# Patient Record
Sex: Male | Born: 1970 | Hispanic: Yes | Marital: Married | Smoking: Never smoker
Health system: Southern US, Community
[De-identification: ages and names within clinical notes are randomized; demographics above are authoritative.]

## PROBLEM LIST (undated history)

## (undated) DIAGNOSIS — F1010 Alcohol abuse, uncomplicated: Secondary | ICD-10-CM

---

## 1998-04-11 ENCOUNTER — Emergency Department (HOSPITAL_COMMUNITY): Admission: EM | Admit: 1998-04-11 | Discharge: 1998-04-11 | Payer: Self-pay | Admitting: Emergency Medicine

## 2001-01-01 ENCOUNTER — Emergency Department (HOSPITAL_COMMUNITY): Admission: EM | Admit: 2001-01-01 | Discharge: 2001-01-01 | Payer: Self-pay | Admitting: Emergency Medicine

## 2001-01-01 ENCOUNTER — Encounter: Payer: Self-pay | Admitting: Emergency Medicine

## 2004-01-15 ENCOUNTER — Emergency Department (HOSPITAL_COMMUNITY): Admission: EM | Admit: 2004-01-15 | Discharge: 2004-01-15 | Payer: Self-pay | Admitting: Family Medicine

## 2004-08-19 ENCOUNTER — Emergency Department (HOSPITAL_COMMUNITY): Admission: EM | Admit: 2004-08-19 | Discharge: 2004-08-19 | Payer: Self-pay | Admitting: Emergency Medicine

## 2007-02-02 ENCOUNTER — Ambulatory Visit: Payer: Self-pay | Admitting: Internal Medicine

## 2007-03-11 ENCOUNTER — Ambulatory Visit: Payer: Self-pay | Admitting: Internal Medicine

## 2007-03-11 LAB — CONVERTED CEMR LAB
Cholesterol: 212 mg/dL — ABNORMAL HIGH (ref 0–200)
HDL: 40 mg/dL (ref >39)
LDL Cholesterol: 142 mg/dL — ABNORMAL HIGH (ref 0–99)
Total CHOL/HDL Ratio: 5.3
Triglycerides: 148 mg/dL (ref <150)
VLDL: 30 mg/dL (ref 0–40)

## 2007-03-18 ENCOUNTER — Ambulatory Visit: Payer: Self-pay | Admitting: *Deleted

## 2007-04-09 ENCOUNTER — Ambulatory Visit: Payer: Self-pay | Admitting: Internal Medicine

## 2007-04-09 LAB — CONVERTED CEMR LAB
ALT: 31 units/L (ref 0–53)
AST: 24 units/L (ref 0–37)
Albumin: 4.6 g/dL (ref 3.5–5.2)
Alkaline Phosphatase: 77 units/L (ref 39–117)
BUN: 15 mg/dL (ref 6–23)
CO2: 26 meq/L (ref 19–32)
Calcium: 9.4 mg/dL (ref 8.4–10.5)
Chloride: 101 meq/L (ref 96–112)
Cholesterol: 185 mg/dL (ref 0–200)
Creatinine, Ser: 1.1 mg/dL (ref 0.40–1.50)
Glucose, Bld: 114 mg/dL — ABNORMAL HIGH (ref 70–99)
HDL: 38 mg/dL — ABNORMAL LOW (ref >39)
LDL Cholesterol: 75 mg/dL (ref 0–99)
Potassium: 3.9 meq/L (ref 3.5–5.3)
Sodium: 138 meq/L (ref 135–145)
Total Bilirubin: 0.4 mg/dL (ref 0.3–1.2)
Total CHOL/HDL Ratio: 4.9
Total Protein: 7.6 g/dL (ref 6.0–8.3)
Triglycerides: 358 mg/dL — ABNORMAL HIGH (ref <150)
VLDL: 72 mg/dL — ABNORMAL HIGH (ref 0–40)

## 2007-06-10 ENCOUNTER — Ambulatory Visit: Payer: Self-pay | Admitting: Internal Medicine

## 2007-08-14 ENCOUNTER — Ambulatory Visit: Payer: Self-pay | Admitting: Internal Medicine

## 2009-06-28 ENCOUNTER — Ambulatory Visit: Payer: Self-pay | Admitting: Family Medicine

## 2012-11-16 ENCOUNTER — Emergency Department (INDEPENDENT_AMBULATORY_CARE_PROVIDER_SITE_OTHER): Payer: BC Managed Care – PPO

## 2012-11-16 ENCOUNTER — Emergency Department (HOSPITAL_COMMUNITY)
Admission: EM | Admit: 2012-11-16 | Discharge: 2012-11-16 | Disposition: A | Discharge status: Home / Self Care | Payer: BC Managed Care – PPO | Source: Home / Self Care | Attending: Family Medicine | Admitting: Family Medicine

## 2012-11-16 ENCOUNTER — Encounter (HOSPITAL_COMMUNITY): Payer: Self-pay | Admitting: Emergency Medicine

## 2012-11-16 DIAGNOSIS — S161XXA Strain of muscle, fascia and tendon at neck level, initial encounter: Secondary | ICD-10-CM

## 2012-11-16 DIAGNOSIS — S139XXA Sprain of joints and ligaments of unspecified parts of neck, initial encounter: Secondary | ICD-10-CM

## 2012-11-16 MED ORDER — IBUPROFEN 600 MG PO TABS: 600.0000 mg | ORAL_TABLET | Freq: Three times a day (TID) | ORAL | Status: DC

## 2012-11-16 MED ORDER — TRAMADOL HCL 50 MG PO TABS: 50.0000 mg | ORAL_TABLET | Freq: Four times a day (QID) | ORAL | Status: DC | PRN

## 2012-11-16 MED ORDER — CYCLOBENZAPRINE HCL 10 MG PO TABS: 10.0000 mg | ORAL_TABLET | Freq: Two times a day (BID) | ORAL | Status: DC | PRN

## 2012-11-16 NOTE — ED Provider Notes (Signed)
CSN: [id number]     Arrival date & time 11/16/12  7829 History   First MD Initiated Contact with Patient 11/16/12 [phone]     Chief Complaint  Patient presents with  . Shoulder Pain    shoulder blade pain x 2 wks.  denies injury.   (Consider location/radiation/quality/duration/timing/severity/associated sxs/prior Treatment) HPI Comments: 42 year old male with no significant past medical history. Here complaining of neck pain and and upper back pain for 2 weeks. Patient states his neck pain is in the middle with radiation to both shoulders and bilateral upper back. Patient reports that he does a lot of extraneous activities involving shoveling at work and lifting.    History reviewed. No pertinent past medical history. History reviewed. No pertinent past surgical history. History reviewed. No pertinent family history. History  Substance Use Topics  . Smoking status: Never Smoker   . Smokeless tobacco: Not on file  . Alcohol Use: Yes    Review of Systems  Constitutional: Negative for fever, chills, diaphoresis, appetite change and fatigue.  HENT: Positive for neck pain and neck stiffness. Negative for sore throat and trouble swallowing.   Respiratory: Negative for cough, shortness of breath and wheezing.   Cardiovascular: Negative for chest pain and leg swelling.  Musculoskeletal: Positive for myalgias and back pain.       Bilateral upper back pain as per history of present illness.  Skin: Negative for rash.  Neurological: Negative for weakness and numbness.  All other systems reviewed and are negative.    Allergies  Review of patient's allergies indicates no known allergies.  Home Medications   Current Outpatient Rx  Name  Route  Sig  Dispense  Refill  . cyclobenzaprine (FLEXERIL) 10 MG tablet   Oral   Take 1 tablet (10 mg total) by mouth 2 (two) times daily as needed for muscle spasms.   20 tablet   0   . ibuprofen (ADVIL,MOTRIN) 600 MG tablet   Oral   Take 1 tablet  (600 mg total) by mouth 3 (three) times daily. Take with meals   30 tablet   0   . traMADol (ULTRAM) 50 MG tablet   Oral   Take 1 tablet (50 mg total) by mouth every 6 (six) hours as needed for pain.   15 tablet   0    BP 138/89  Pulse 76  Temp(Src) 98.1 F (36.7 C) (Oral)  Resp 16  SpO2 99% Physical Exam  Vitals reviewed. Constitutional: He is oriented to person, place, and time. He appears well-developed and well-nourished. No distress.  HENT:  Head: Normocephalic and atraumatic.  Mouth/Throat: Oropharynx is clear and moist. No oropharyngeal exudate.  Eyes: Conjunctivae are normal. No scleral icterus.  Neck: Normal range of motion. Neck supple. No JVD present.  Cardiovascular: Normal heart sounds.   Pulmonary/Chest: Breath sounds normal. No respiratory distress. He has no wheezes. He has no rales. He exhibits no tenderness.  Musculoskeletal:  Cervical spine central: No obvious deformity. No pain over bone processes.  Fair range of motion despite reported pain. Able to touch chest with chin and extend with minimal discomfort. Increased tone and tenderness to palpation over trapezius muscles bilaterally. Negative Spurling test. No obvious scoliosis or kyphosis. No pain over bone processes in entire spine.  Good range of motion.  Able to bend forward (flexion) past 90 degrees and posteriorly (extension) 25 degrees.  Normal bilateral lateralization 25 degress. Despite good range of motion there is reported pain with palpation and increased tone in  bilateral thoracic paravertebral muscles.    Lymphadenopathy:    He has no cervical adenopathy.  Neurological: He is alert and oriented to person, place, and time. He has normal strength and normal reflexes. No cranial nerve deficit or sensory deficit.  Skin: No rash noted. He is not diaphoretic.    ED Course  Procedures (including critical care time) Labs Review Labs Reviewed - No data to display Imaging Review Dg Cervical  Spine Complete  11/16/2012   CLINICAL DATA:  Pain  EXAM: CERVICAL SPINE  4+ VIEWS  COMPARISON:  None.  FINDINGS: Frontal, lateral, open-mouth odontoid, and bilateral oblique views were obtained. There is no fracture or spondylolisthesis. Prevertebral soft tissues and predental space regions are normal.  There is moderate disc space narrowing at C5-6, C6-7, and C7-T1. There is exit foraminal narrowing at C5-6 and C6-7 bilaterally. No erosive change.  IMPRESSION: Moderate osteoarthritic change. No fracture or spondylolisthesis.   Electronically Signed   By: Bretta Bang   On: 11/16/2012 11:01    MDM   1. Neck strain, initial encounter    Treated with Flexeril, ibuprofen and tramadol. Supportive care including rehabilitation exercises and red flags that should prompt his return to medical attention discussed with patient in Spanish and provided in writing.    Sharin Grave, MD 11/17/12 1445

## 2012-11-16 NOTE — ED Notes (Signed)
C/o shoulder blade pain x two weeks. Gradual onset. States pain is felt with movement. Also states that he does a lot of shoveling at work. Denies any heavy lifting or injury.  Pt has tried otc pain meds with no relief in symptoms.

## 2013-07-28 ENCOUNTER — Encounter (HOSPITAL_COMMUNITY): Payer: Self-pay | Admitting: Emergency Medicine

## 2013-07-28 ENCOUNTER — Emergency Department (HOSPITAL_COMMUNITY)
Admission: EM | Admit: 2013-07-28 | Discharge: 2013-07-28 | Disposition: A | Discharge status: Home / Self Care | Payer: BC Managed Care – PPO | Source: Home / Self Care

## 2013-07-28 DIAGNOSIS — L0390 Cellulitis, unspecified: Secondary | ICD-10-CM

## 2013-07-28 DIAGNOSIS — L0291 Cutaneous abscess, unspecified: Secondary | ICD-10-CM

## 2013-07-28 MED ORDER — CEPHALEXIN 500 MG PO CAPS: 500.0000 mg | ORAL_CAPSULE | Freq: Two times a day (BID) | ORAL | Status: DC

## 2013-07-28 NOTE — ED Provider Notes (Signed)
CSN: [id number]     Arrival date & time 07/28/13  0810 History   None    Chief Complaint  Patient presents with  . Back Pain   (Consider location/radiation/quality/duration/timing/severity/associated sxs/prior Treatment) HPI  Butt pain: started 5 days ago. L side. Mild radiation down L leg. Non draining bump present. Denies fevers. Advil w/ some relief of pain. Getting worse. Very painful to sit. Denies CP, SOB. Tight and achy in nature but occasionally sharp.    History reviewed. No pertinent past medical history. History reviewed. No pertinent past surgical history. No family history on file. History  Substance Use Topics  . Smoking status: Never Smoker   . Smokeless tobacco: Not on file  . Alcohol Use: Yes    Review of Systems  Constitutional: Positive for activity change. Negative for fever.  Skin: Positive for rash.  All other systems reviewed and are negative.   Allergies  Review of patient's allergies indicates no known allergies.  Home Medications   Prior to Admission medications   Medication Sig Start Date End Date Taking? Authorizing Provider  cephALEXin (KEFLEX) 500 MG capsule Take 1 capsule (500 mg total) by mouth 2 (two) times daily. 07/28/13   Ozella Rocks, MD  cyclobenzaprine (FLEXERIL) 10 MG tablet Take 1 tablet (10 mg total) by mouth 2 (two) times daily as needed for muscle spasms. 11/16/12   Adlih Moreno-Coll, MD  ibuprofen (ADVIL,MOTRIN) 600 MG tablet Take 1 tablet (600 mg total) by mouth 3 (three) times daily. Take with meals 11/16/12   Adlih Moreno-Coll, MD  traMADol (ULTRAM) 50 MG tablet Take 1 tablet (50 mg total) by mouth every 6 (six) hours as needed for pain. 11/16/12   Adlih Moreno-Coll, MD   BP 144/91  Pulse 78  Temp(Src) 98.4 F (36.9 C) (Oral)  Resp 18  SpO2 97% Physical Exam  Constitutional: He is oriented to person, place, and time. He appears well-developed and well-nourished. No distress.  HENT:  Head: Normocephalic and atraumatic.   Eyes: EOM are normal. Pupils are equal, round, and reactive to light.  Neck: Normal range of motion. Neck supple.  Cardiovascular: Normal rate.   Pulmonary/Chest: Effort normal. No respiratory distress.  Abdominal: Soft. He exhibits no distension.  Musculoskeletal: Normal range of motion. He exhibits no tenderness.  Neurological: He is alert and oriented to person, place, and time.  Skin: He is not diaphoretic.  L medial gluteal fold w/ 2x3cm area of induration erythema and tenderness. No underlying fluctuance. No tract or area of discharge.   Psychiatric: He has a normal mood and affect. His behavior is normal. Judgment and thought content normal.    ED Course  Procedures (including critical care time) Labs Review Labs Reviewed - No data to display  Imaging Review No results found.   MDM   1. Cellulitis    Cellulitits w/o appreciable abscess. Keflex w/ warm compresses. Advil 600 prn relief.  Precautions givena nd all questions answered.   Shelly Flatten, MD Family Medicine PGY-3 07/28/2013, 9:16 AM      Ozella Rocks, MD 07/28/13 [phone]

## 2013-07-28 NOTE — ED Provider Notes (Signed)
Medical screening examination/treatment/procedure(s) were performed by a resident physician or non-physician practitioner and as the supervising physician I was immediately available for consultation/collaboration.  Mija Effertz, MD    Nyari Olsson S Katarina Riebe, MD 07/28/13 1630 

## 2013-07-28 NOTE — Discharge Instructions (Signed)
You have a skin infection that needs antibiotics to heal.  Please start the keflex and take them until they are gone Please either apply warm compresses to the affected area 2-4 times a day or take a hot bath a couple times a day until you are feeling better. If this returns after the medicine is gone, come back as we may need to cut out the infection Please use 600mg  of Advil every 6 hours for relief.   Usted tiene una infeccin de la piel que necesita antibiticos para sanar. Por favor inicie la keflex y llevarlos hasta que se han ido Por favor, sea aplicar compresas calientes en la zona afectada 2-4 veces al da o tomar un bao caliente un par de veces al da [employer] se sienta mejor. Si esto vuelve luego de la medicina se ha ido, Programme researcher, broadcasting/film/video como es posible que tengamos que cortar la infeccin [employer] 600mg  de Advil cada 6 horas para el alivio.   Celulitis (Celulitis)  La celulitis es una infeccin de la piel y del tejido que se encuentra debajo de la piel. El rea infectada generalmente est de color rojo y duele. Ocurre con ms frecuencia en los brazos y en las piernas. CUIDADOS EN EL HOGAR   Tome los antibiticos tal como se le indic. Finalice el [place], aunque comience a Actor.  Mantenga el brazo o la pierna infectada elevada.  Aplique paos calientes en la zona hasta 4 veces al da.  Tome slo los medicamentos que le haya indicado el mdico.  Cumpla con los controles mdicos segn las indicaciones. SOLICITE AYUDA DE INMEDIATO SI:   Tiene fiebre.  Se siente muy somnoliento.  Vomita o tiene diarrea.  Se siente enfermo y tiene [employer].  Observa una lnea roja en la piel que sale desde la herida.  El rea roja se extiende o se vuelve de color oscuro.  El hueso o la articulacin que se encuentran por debajo de la zona infectada le duelen despus de que la piel se [place].  La infeccin se repite en la misma zona o en una zona diferente.  Tiene un  bulto inflamado (hinchado) en la zona infectada.  Aparecen nuevos sntomas. ASEGRESE DE QUE:   Comprende estas instrucciones.  Controlar su enfermedad.  Solicitar ayuda de inmediato si no mejora o si empeora. Document Released: 07/25/2009 Document Revised: 08/06/2011 [facility] Patient Information 2014 [place], [place].

## 2013-07-28 NOTE — ED Notes (Signed)
Left buttocks pain, some pain into left leg, onset 07/22/13.  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

## 2013-08-09 ENCOUNTER — Encounter (HOSPITAL_COMMUNITY): Payer: Self-pay | Admitting: Emergency Medicine

## 2013-08-09 ENCOUNTER — Emergency Department (HOSPITAL_COMMUNITY)
Admission: EM | Admit: 2013-08-09 | Discharge: 2013-08-09 | Disposition: A | Discharge status: Other Acute Inpatient Hospital | Payer: BC Managed Care – PPO | Source: Home / Self Care | Attending: Family Medicine | Admitting: Family Medicine

## 2013-08-09 ENCOUNTER — Emergency Department (HOSPITAL_COMMUNITY)
Admission: EM | Admit: 2013-08-09 | Discharge: 2013-08-10 | Disposition: A | Discharge status: Home / Self Care | Payer: BC Managed Care – PPO | Attending: Emergency Medicine | Admitting: Emergency Medicine

## 2013-08-09 DIAGNOSIS — L0231 Cutaneous abscess of buttock: Secondary | ICD-10-CM | POA: Insufficient documentation

## 2013-08-09 DIAGNOSIS — K611 Rectal abscess: Secondary | ICD-10-CM

## 2013-08-09 DIAGNOSIS — K612 Anorectal abscess: Secondary | ICD-10-CM

## 2013-08-09 DIAGNOSIS — L03317 Cellulitis of buttock: Secondary | ICD-10-CM

## 2013-08-09 DIAGNOSIS — Z792 Long term (current) use of antibiotics: Secondary | ICD-10-CM | POA: Insufficient documentation

## 2013-08-09 MED ORDER — ONDANSETRON HCL 4 MG/2ML IJ SOLN
4.0000 mg | Freq: Once | INTRAMUSCULAR | Status: AC
  Administered 2013-08-10: 4 mg via INTRAVENOUS
  Filled 2013-08-09: qty 2

## 2013-08-09 MED ORDER — HYDROMORPHONE HCL PF 1 MG/ML IJ SOLN
1.0000 mg | Freq: Once | INTRAMUSCULAR | Status: AC
  Administered 2013-08-10: 1 mg via INTRAVENOUS
  Filled 2013-08-09: qty 1

## 2013-08-09 NOTE — ED Provider Notes (Signed)
CSN: [id number]     Arrival date & time 08/09/13  1543 History   First MD Initiated Contact with Patient 08/09/13 1657     Chief Complaint  Patient presents with  . Tailbone Pain   (Consider location/radiation/quality/duration/timing/severity/associated sxs/prior Treatment) HPI Comments: 43 year old male presents complaining of left buttock pain. He was seen here previously 12 days ago and was treated for cellulitis with Keflex, this has not helped at all and the pain continues to worsen. He has pain with trying to sit down as well as severe pain with bowel movements. He denies any fever, chills, NVD. He has never had this before.   History reviewed. No pertinent past medical history. History reviewed. No pertinent past surgical history. History reviewed. No pertinent family history. History  Substance Use Topics  . Smoking status: Never Smoker   . Smokeless tobacco: Not on file  . Alcohol Use: Yes    Review of Systems  Gastrointestinal: Positive for rectal pain.  All other systems reviewed and are negative.   Allergies  Review of patient's allergies indicates no known allergies.  Home Medications   Prior to Admission medications   Medication Sig Start Date End Date Taking? Authorizing Provider  cephALEXin (KEFLEX) 500 MG capsule Take 1 capsule (500 mg total) by mouth 2 (two) times daily. 07/28/13   Ozella Rocksavid J Merrell, MD  cyclobenzaprine (FLEXERIL) 10 MG tablet Take 1 tablet (10 mg total) by mouth 2 (two) times daily as needed for muscle spasms. 11/16/12   Adlih Moreno-Coll, MD  ibuprofen (ADVIL,MOTRIN) 600 MG tablet Take 1 tablet (600 mg total) by mouth 3 (three) times daily. Take with meals 11/16/12   Adlih Moreno-Coll, MD  traMADol (ULTRAM) 50 MG tablet Take 1 tablet (50 mg total) by mouth every 6 (six) hours as needed for pain. 11/16/12   Adlih Moreno-Coll, MD   BP 132/76  Pulse 78  Temp(Src) 98.4 F (36.9 C) (Oral)  Resp 16  SpO2 98% Physical Exam  Nursing note and vitals  reviewed. Constitutional: He is oriented to person, place, and time. He appears well-developed and well-nourished. No distress.  HENT:  Head: Normocephalic.  Pulmonary/Chest: Effort normal. No respiratory distress.  Genitourinary: Rectal exam shows tenderness (internal rectal tenderness on the left wall of the rectum up to the dentate line). Rectal exam shows no external hemorrhoid, no internal hemorrhoid, no fissure, no mass and anal tone normal.     Neurological: He is alert and oriented to person, place, and time. Coordination normal.  Skin: Skin is warm and dry. No rash noted. He is not diaphoretic.  Psychiatric: He has a normal mood and affect. Judgment normal.    ED Course  Procedures (including critical care time) Labs Review Labs Reviewed - No data to display  Imaging Review No results found.   MDM   1. Perirectal cellulitis    This patient failed outpatient therapy with antibiotics, although the antibiotic regimen chosen was grossly inadequate. Differential includes perirectal cellulitis, perirectal abscess, infected anal fistula. Would probably benefit from CT scan to clear up the diagnosis and possible surgical consult if needed.    Graylon GoodZachary H Baker, PA-C 08/09/13 [phone]

## 2013-08-09 NOTE — ED Notes (Signed)
Pain in left buttocks, not improved after completion of medication. Was instructed to recheck if no better

## 2013-08-09 NOTE — ED Notes (Signed)
Pt sent here from urgent care with c/o continued pain in rectal area-- sent for further eval to r/o fistula vs abscess.

## 2013-08-10 ENCOUNTER — Emergency Department (HOSPITAL_COMMUNITY): Payer: BC Managed Care – PPO

## 2013-08-10 ENCOUNTER — Encounter (HOSPITAL_COMMUNITY): Payer: Self-pay | Admitting: Radiology

## 2013-08-10 LAB — CBC WITH DIFFERENTIAL/PLATELET
BASOS PCT: 1 % (ref 0–1)
Basophils Absolute: 0 10*3/uL (ref 0.0–0.1)
EOS ABS: 0.2 10*3/uL (ref 0.0–0.7)
Eosinophils Relative: 3 % (ref 0–5)
HCT: 42.9 % (ref 39.0–52.0)
Hemoglobin: 14.9 g/dL (ref 13.0–17.0)
Lymphocytes Relative: 25 % (ref 12–46)
Lymphs Abs: 1.5 10*3/uL (ref 0.7–4.0)
MCH: 30.8 pg (ref 26.0–34.0)
MCHC: 34.7 g/dL (ref 30.0–36.0)
MCV: 88.8 fL (ref 78.0–100.0)
Monocytes Absolute: 0.5 10*3/uL (ref 0.1–1.0)
Monocytes Relative: 8 % (ref 3–12)
NEUTROS PCT: 63 % (ref 43–77)
Neutro Abs: 3.8 10*3/uL (ref 1.7–7.7)
Platelets: 181 10*3/uL (ref 150–400)
RBC: 4.83 MIL/uL (ref 4.22–5.81)
RDW: 12.7 % (ref 11.5–15.5)
WBC: 6 10*3/uL (ref 4.0–10.5)

## 2013-08-10 LAB — BASIC METABOLIC PANEL
BUN: 16 mg/dL (ref 6–23)
CO2: 28 mEq/L (ref 19–32)
Calcium: 9.6 mg/dL (ref 8.4–10.5)
Chloride: 101 mEq/L (ref 96–112)
Creatinine, Ser: 1.02 mg/dL (ref 0.50–1.35)
GFR calc non Af Amer: 88 mL/min — ABNORMAL LOW (ref >90)
GLUCOSE: 112 mg/dL — AB (ref 70–99)
Potassium: 3.7 mEq/L (ref 3.7–5.3)
Sodium: 142 mEq/L (ref 137–147)

## 2013-08-10 MED ORDER — METRONIDAZOLE 500 MG PO TABS: 500.0000 mg | ORAL_TABLET | Freq: Two times a day (BID) | ORAL | Status: DC

## 2013-08-10 MED ORDER — IOHEXOL 300 MG/ML  SOLN
100.0000 mL | Freq: Once | INTRAMUSCULAR | Status: AC | PRN
Start: 2013-08-10 — End: 2013-08-10
  Administered 2013-08-10: 100 mL via INTRAVENOUS

## 2013-08-10 MED ORDER — CIPROFLOXACIN HCL 500 MG PO TABS: 500.0000 mg | ORAL_TABLET | Freq: Two times a day (BID) | ORAL | Status: DC

## 2013-08-10 MED ORDER — OXYCODONE-ACETAMINOPHEN 5-325 MG PO TABS: 2.0000 | ORAL_TABLET | ORAL | Status: DC | PRN

## 2013-08-10 NOTE — ED Notes (Signed)
Patient transported to CT 

## 2013-08-10 NOTE — ED Notes (Signed)
CT notified patient completed his contrast.

## 2013-08-10 NOTE — ED Provider Notes (Signed)
CSN: [id number]     Arrival date & time 08/09/13  1804 History   First MD Initiated Contact with Patient 08/09/13 2338     Chief Complaint  Patient presents with  . Rectal Pain      The history is provided by the patient. A language interpreter was used ([id number]).  Patient reports he has had rectal pain for several weeks.  He denies trauma No fever/vomiting/abdominal pain He has never had this before. His course is worsening and his pain is worse with palpation  He was sent from urgent care for further evaluation  PMH - none  History  Substance Use Topics  . Smoking status: Never Smoker   . Smokeless tobacco: Not on file  . Alcohol Use: Yes     Comment: occasionaly    Review of Systems  Constitutional: Negative for fever.  Gastrointestinal: Positive for rectal pain. Negative for vomiting.  Neurological: Negative for weakness.  All other systems reviewed and are negative.     Allergies  Review of patient's allergies indicates no known allergies.  Home Medications   Prior to Admission medications   Medication Sig Start Date End Date Taking? Authorizing Provider  cephALEXin (KEFLEX) 500 MG capsule Take 1 capsule (500 mg total) by mouth 2 (two) times daily. 07/28/13   Ozella Rocksavid J Merrell, MD   BP 141/88  Pulse 74  Temp(Src) 98.2 F (36.8 C) (Oral)  Resp 16  Wt 170 lb (77.111 kg)  SpO2 98% Physical Exam CONSTITUTIONAL: Well developed/well nourished HEAD: Normocephalic/atraumatic EYES: EOMI/PERRL ENMT: Mucous membranes moist NECK: supple no meningeal signs CV: S1/S2 noted, no murmurs/rubs/gallops noted LUNGS: Lungs are clear to auscultation bilaterally, no apparent distress ABDOMEN: soft, nontender, no rebound or guarding Rectal - tenderness noted with palpation  externally, no drainage/bleeding noted, see urgent care note for further details NEURO: Pt is awake/alert, moves all extremitiesx4 EXTREMITIES: pulses normal, full ROM SKIN: warm, color normal PSYCH: no  abnormalities of mood noted  ED Course  Procedures  12:24 AM Pt sent from urgent care for CT imaging Pt stable at this time 2:51 AM Ct does not show any abscess Pt resting comfortably Will d/c keflex and start cipro/flagyll Advised to return in 48 hours if no improvement with cipro/flagyll  Labs Review Labs Reviewed  BASIC METABOLIC PANEL - Abnormal; Notable for the following:    Glucose, Bld 112 (*)    GFR calc non Af Amer 88 (*)    All other components within normal limits  CBC WITH DIFFERENTIAL    Imaging Review Ct Abdomen Pelvis W Contrast  08/10/2013   CLINICAL DATA:  Left buttock pain.  Rectal pain.  EXAM: CT ABDOMEN AND PELVIS WITH CONTRAST  TECHNIQUE: Multidetector CT imaging of the abdomen and pelvis was performed using the standard protocol following bolus administration of intravenous contrast.  CONTRAST:  100mL OMNIPAQUE IOHEXOL 300 MG/ML  SOLN  COMPARISON:  None.  FINDINGS: Dependent subsegmental atelectasis in both lower lobes.  Diffuse hepatic steatosis. Spleen, pancreas, and adrenal glands unremarkable.  No specific gallbladder or biliary abnormality identified. Kidneys and proximal ureters unremarkable. No pathologic upper abdominal adenopathy is observed. Appendix normal.  No pathologic pelvic adenopathy is observed.  No significant gluteal muscular asymmetry. No hip effusion identified.  There is some subcutaneous edema along the medial left buttock thought to likely be outside of the external sphincter primarily in the subcutaneous tissues just to the left of the intergluteal fold. Appearance favors a local inflammatory process ; no abscess identified.  Right pars defect at L5 with spina bifida occulta at L5, and 6 mm of anterolisthesis at L5-S1. There is likely some degree of right foraminal stenosis at this level.  IMPRESSION: 1. Subtle inflammatory focus in the subcutaneous tissues along the left side of the intergluteal fold, compatible with local inflammation. This is  primarily external to the external sphincter although does extend tangential to the inferior margin of the external sphincter. 2. Diffuse hepatic steatosis. 3. 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 Probable right foraminal stenosis at this level.   Electronically Signed   By: Herbie BaltimoreWalt  Liebkemann M.D.   On: 08/10/2013 02:02      MDM   Final diagnoses:  Cellulitis of gluteal region    Nursing notes including past medical history and social history reviewed and considered in documentation Labs/vital reviewed and considered Previous records reviewed and considered     Joya Gaskinsonald W Ellyanna Holton, MD 08/10/13 [phone]

## 2013-08-10 NOTE — Discharge Instructions (Signed)
Celulitis (Celulitis)  La celulitis es una infeccin de la piel y del tejido que se encuentra debajo de la piel. El rea infectada generalmente est de color rojo y duele. Ocurre con ms frecuencia en los brazos y en las piernas. CUIDADOS EN EL HOGAR   Tome los antibiticos tal como se le indic. Finalice el [employer], aunque comience a Act[employer]entirse mejor.  Mantenga el brazo o la pierna infectada elevada.  Aplique paos calientes en la zona hasta 4 veces al da.  Tome slo los medicamentos que le haya indicado el mdico.  Cumpla con los controles mdicos segn las indicaciones. SOLICITE AYUDA DE INMEDIATO SI:   Tiene fiebre.  Se siente muy somnoliento.  Vomita o tiene diarrea.  Se siente enfermo y tiene Smith Internationaldolores musculares.  Observa una lnea roja en la piel que sale desde la herida.  El rea roja se extiende o se vuelve de color oscuro.  El hueso o la articulacin que se encuentran por debajo de la zona infectada le duelen despus de que la piel se [place].  La infeccin se repite en la misma zona o en una zona diferente.  Tiene un bulto inflamado (hinchado) en la zona infectada.  Aparecen nuevos sntomas. ASEGRESE DE QUE:   Comprende estas instrucciones.  Controlar su enfermedad.  Solicitar ayuda de inmediato si no mejora o si empeora. Document Released: 07/25/2009 Document Revised: 08/06/2011 [facility] Patient Information 2015 [place], [place]. This information is not intended to replace advice given to you by your health care provider. Make sure you discuss any questions you have with your health care provider.

## 2013-08-10 NOTE — ED Provider Notes (Signed)
Medical screening examination/treatment/procedure(s) were performed by resident physician or non-physician practitioner and as supervising physician I was immediately available for consultation/collaboration.   KINDL,JAMES DOUGLAS MD.   James D Kindl, MD 08/10/13 1507 

## 2013-08-10 NOTE — ED Notes (Signed)
Pt refused WC. NT escorted to ED exit

## 2014-10-31 IMAGING — CR DG CERVICAL SPINE COMPLETE 4+V
7 series · 7 of 7 positions shown · non-contrast
Comparison: None.

CLINICAL DATA: Pain

EXAM:
CERVICAL SPINE  4+ VIEWS

[view not recorded (1 of 7)]
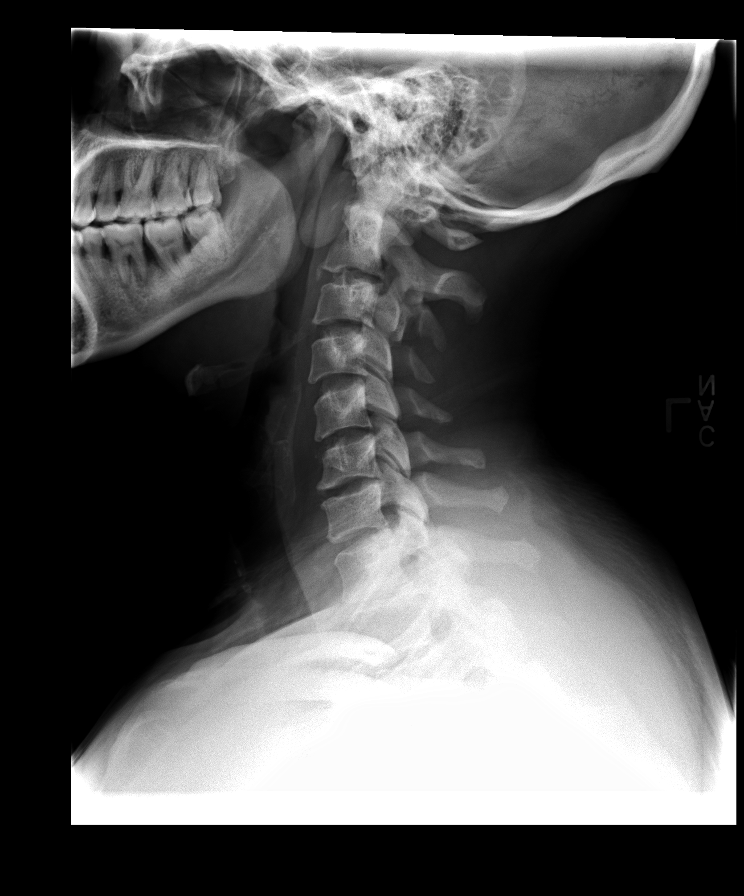

[view not recorded (2 of 7)]
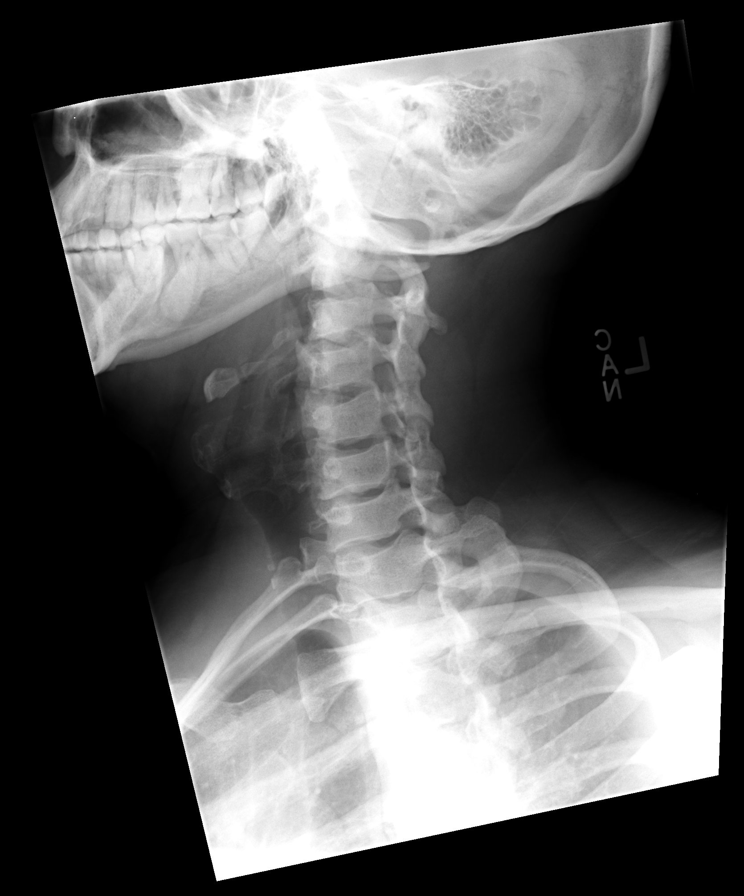

[view not recorded (3 of 7)]
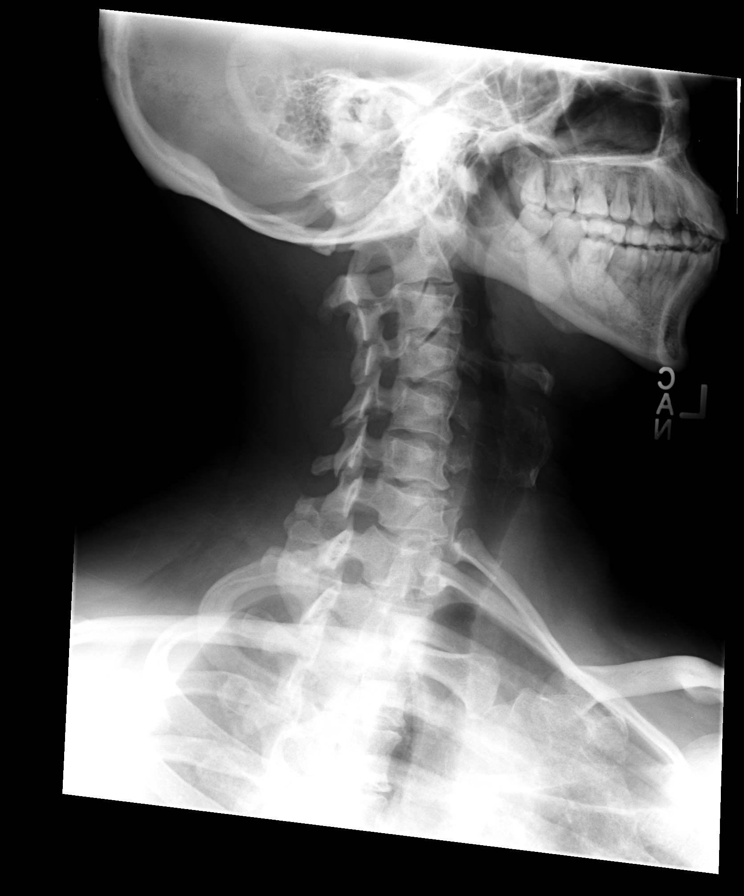

[view not recorded (4 of 7)]
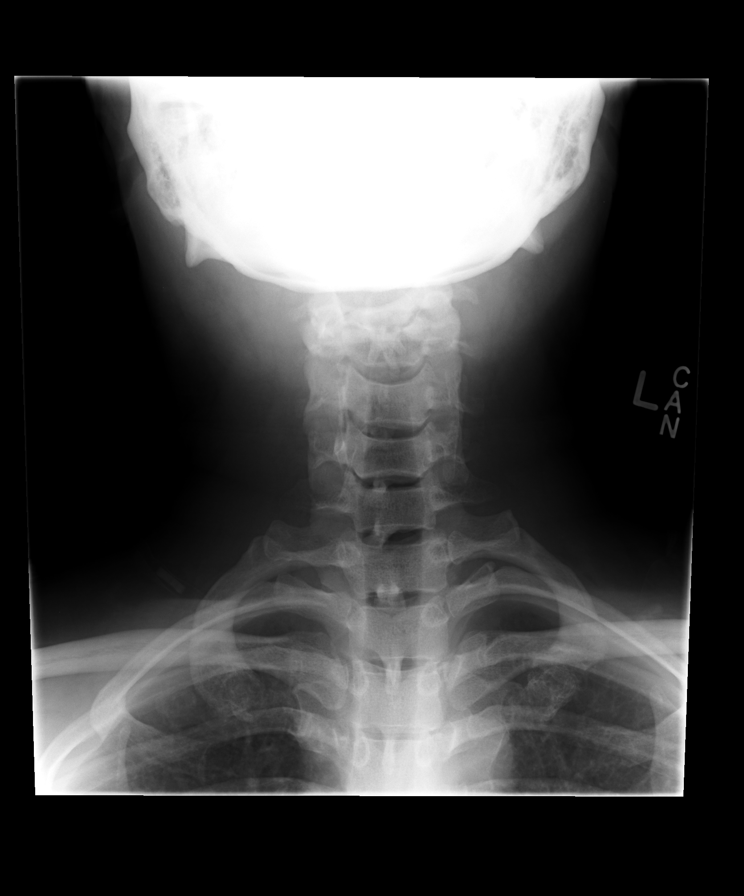

[view not recorded (5 of 7)]
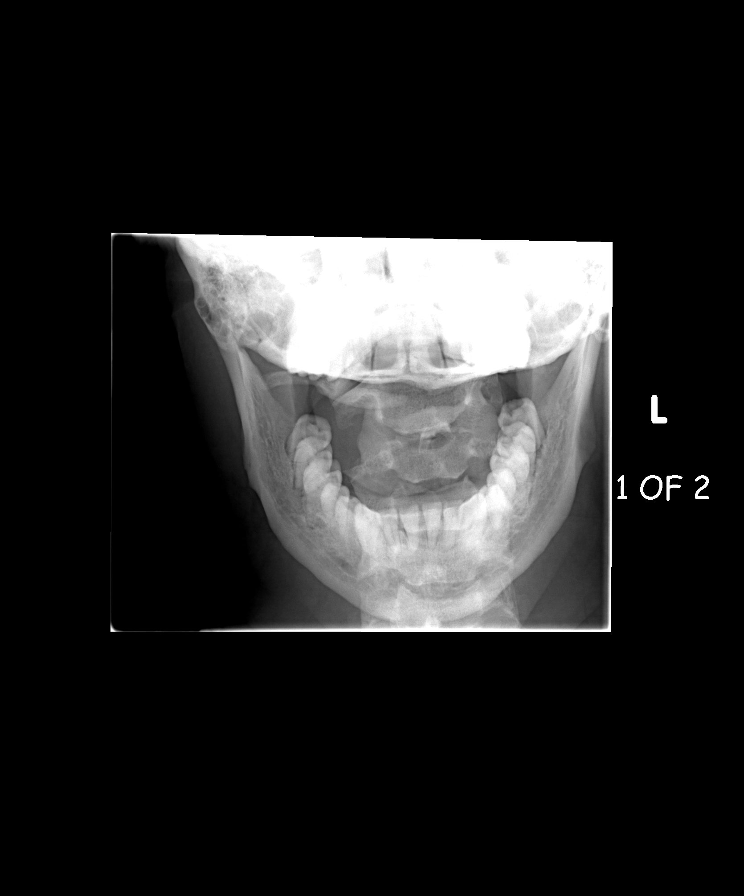

[view not recorded (6 of 7)]
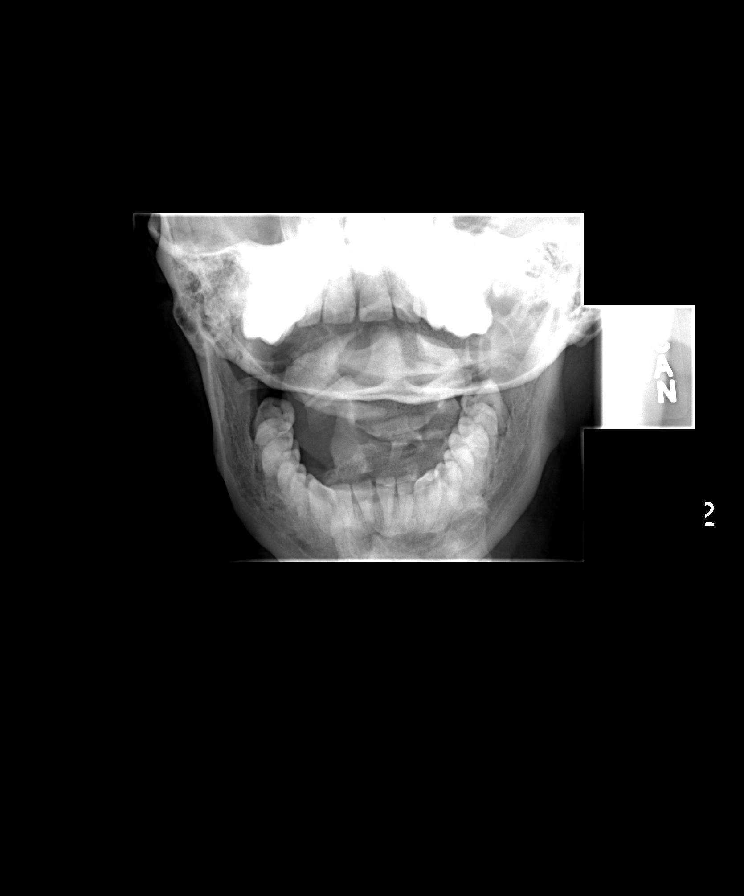

[view not recorded (7 of 7)]
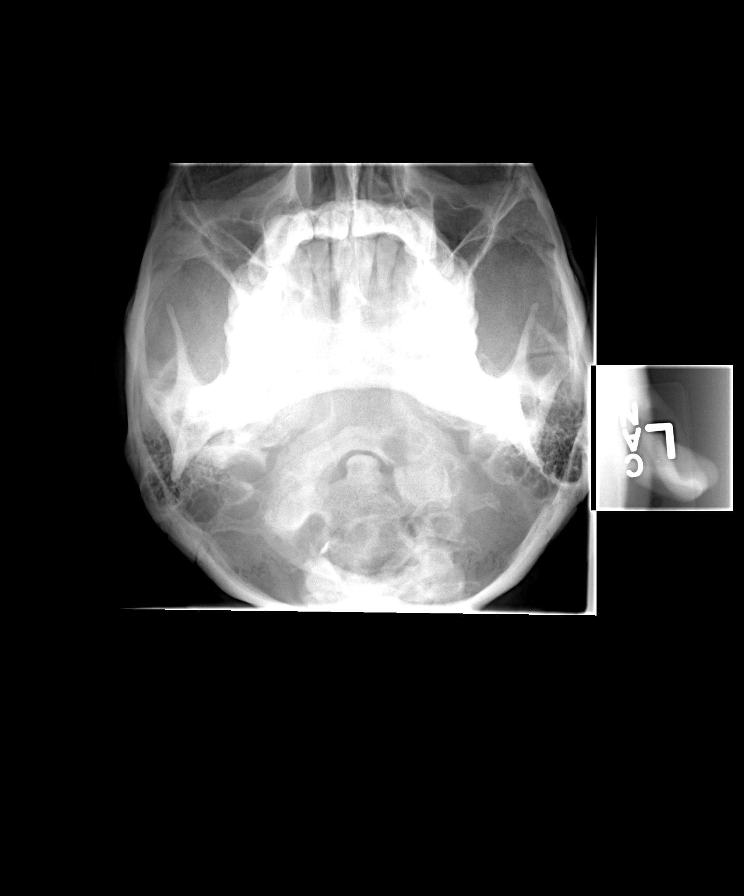

[7 of 7 positions shown; findings below may reference images not displayed]

FINDINGS: Frontal, lateral, open-mouth odontoid, and bilateral oblique views
were obtained. There is no fracture or spondylolisthesis.
Prevertebral soft tissues and predental space regions are normal.

There is moderate disc space narrowing at C5-6, C6-7, and C7-T1.
There is exit foraminal narrowing at C5-6 and C6-7 bilaterally. No
erosive change.
IMPRESSION: Moderate osteoarthritic change. No fracture or spondylolisthesis.

## 2020-01-12 ENCOUNTER — Emergency Department (HOSPITAL_COMMUNITY): Payer: BC Managed Care – PPO

## 2020-01-12 ENCOUNTER — Inpatient Hospital Stay (HOSPITAL_COMMUNITY)
Admission: EM | Admit: 2020-01-12 | Discharge: 2020-01-17 | DRG: 177 | Disposition: A | Discharge status: Home / Self Care | Payer: BC Managed Care – PPO | Attending: Internal Medicine | Admitting: Internal Medicine

## 2020-01-12 ENCOUNTER — Other Ambulatory Visit: Payer: Self-pay

## 2020-01-12 ENCOUNTER — Encounter (HOSPITAL_COMMUNITY): Payer: Self-pay

## 2020-01-12 DIAGNOSIS — D6959 Other secondary thrombocytopenia: Secondary | ICD-10-CM | POA: Diagnosis present

## 2020-01-12 DIAGNOSIS — T380X5A Adverse effect of glucocorticoids and synthetic analogues, initial encounter: Secondary | ICD-10-CM | POA: Diagnosis not present

## 2020-01-12 DIAGNOSIS — E1165 Type 2 diabetes mellitus with hyperglycemia: Secondary | ICD-10-CM | POA: Diagnosis not present

## 2020-01-12 DIAGNOSIS — J1282 Pneumonia due to coronavirus disease 2019: Secondary | ICD-10-CM | POA: Diagnosis present

## 2020-01-12 DIAGNOSIS — R7982 Elevated C-reactive protein (CRP): Secondary | ICD-10-CM | POA: Diagnosis present

## 2020-01-12 DIAGNOSIS — R7989 Other specified abnormal findings of blood chemistry: Secondary | ICD-10-CM | POA: Diagnosis present

## 2020-01-12 DIAGNOSIS — Z6830 Body mass index (BMI) 30.0-30.9, adult: Secondary | ICD-10-CM | POA: Diagnosis not present

## 2020-01-12 DIAGNOSIS — J9601 Acute respiratory failure with hypoxia: Secondary | ICD-10-CM | POA: Diagnosis present

## 2020-01-12 DIAGNOSIS — J189 Pneumonia, unspecified organism: Secondary | ICD-10-CM | POA: Diagnosis present

## 2020-01-12 DIAGNOSIS — E669 Obesity, unspecified: Secondary | ICD-10-CM | POA: Diagnosis present

## 2020-01-12 DIAGNOSIS — R0902 Hypoxemia: Secondary | ICD-10-CM

## 2020-01-12 DIAGNOSIS — U071 COVID-19: Secondary | ICD-10-CM | POA: Diagnosis present

## 2020-01-12 DIAGNOSIS — Z833 Family history of diabetes mellitus: Secondary | ICD-10-CM

## 2020-01-12 DIAGNOSIS — E876 Hypokalemia: Secondary | ICD-10-CM | POA: Diagnosis present

## 2020-01-12 DIAGNOSIS — E119 Type 2 diabetes mellitus without complications: Secondary | ICD-10-CM | POA: Diagnosis not present

## 2020-01-12 DIAGNOSIS — F1010 Alcohol abuse, uncomplicated: Secondary | ICD-10-CM | POA: Diagnosis present

## 2020-01-12 DIAGNOSIS — A4189 Other specified sepsis: Secondary | ICD-10-CM | POA: Diagnosis present

## 2020-01-12 DIAGNOSIS — R9431 Abnormal electrocardiogram [ECG] [EKG]: Secondary | ICD-10-CM | POA: Diagnosis present

## 2020-01-12 DIAGNOSIS — Z8249 Family history of ischemic heart disease and other diseases of the circulatory system: Secondary | ICD-10-CM

## 2020-01-12 DIAGNOSIS — D696 Thrombocytopenia, unspecified: Secondary | ICD-10-CM

## 2020-01-12 DIAGNOSIS — A419 Sepsis, unspecified organism: Secondary | ICD-10-CM

## 2020-01-12 DIAGNOSIS — I340 Nonrheumatic mitral (valve) insufficiency: Secondary | ICD-10-CM | POA: Diagnosis not present

## 2020-01-12 HISTORY — DX: Alcohol abuse, uncomplicated: F10.10

## 2020-01-12 LAB — CBC WITH DIFFERENTIAL/PLATELET
Abs Immature Granulocytes: 0.02 10*3/uL (ref 0.00–0.07)
Basophils Absolute: 0 10*3/uL (ref 0.0–0.1)
Basophils Relative: 0 %
Eosinophils Absolute: 0 10*3/uL (ref 0.0–0.5)
Eosinophils Relative: 0 %
HCT: 46.8 % (ref 39.0–52.0)
Hemoglobin: 16.6 g/dL (ref 13.0–17.0)
Immature Granulocytes: 0 %
Lymphocytes Relative: 12 %
Lymphs Abs: 0.6 10*3/uL — ABNORMAL LOW (ref 0.7–4.0)
MCH: 30.5 pg (ref 26.0–34.0)
MCHC: 35.5 g/dL (ref 30.0–36.0)
MCV: 86 fL (ref 80.0–100.0)
Monocytes Absolute: 0.2 10*3/uL (ref 0.1–1.0)
Monocytes Relative: 3 %
Neutro Abs: 4.4 10*3/uL (ref 1.7–7.7)
Neutrophils Relative %: 85 %
Platelets: 143 10*3/uL — ABNORMAL LOW (ref 150–400)
RBC: 5.44 MIL/uL (ref 4.22–5.81)
RDW: 11.9 % (ref 11.5–15.5)
WBC: 5.2 10*3/uL (ref 4.0–10.5)
nRBC: 0 % (ref 0.0–0.2)

## 2020-01-12 LAB — BRAIN NATRIURETIC PEPTIDE: B Natriuretic Peptide: 28.3 pg/mL (ref 0.0–100.0)

## 2020-01-12 LAB — COMPREHENSIVE METABOLIC PANEL
ALT: 36 U/L (ref 0–44)
AST: 43 U/L — ABNORMAL HIGH (ref 15–41)
Albumin: 3.9 g/dL (ref 3.5–5.0)
Alkaline Phosphatase: 68 U/L (ref 38–126)
Anion gap: 13 (ref 5–15)
BUN: 10 mg/dL (ref 6–20)
CO2: 25 mmol/L (ref 22–32)
Calcium: 8.6 mg/dL — ABNORMAL LOW (ref 8.9–10.3)
Chloride: 95 mmol/L — ABNORMAL LOW (ref 98–111)
Creatinine, Ser: 0.87 mg/dL (ref 0.61–1.24)
GFR, Estimated: >60 mL/min (ref >60)
Glucose, Bld: 198 mg/dL — ABNORMAL HIGH (ref 70–99)
Potassium: 3.4 mmol/L — ABNORMAL LOW (ref 3.5–5.1)
Sodium: 133 mmol/L — ABNORMAL LOW (ref 135–145)
Total Bilirubin: 0.9 mg/dL (ref 0.3–1.2)
Total Protein: 8.2 g/dL — ABNORMAL HIGH (ref 6.5–8.1)

## 2020-01-12 LAB — TRIGLYCERIDES: Triglycerides: 69 mg/dL (ref <150)

## 2020-01-12 LAB — D-DIMER, QUANTITATIVE: D-Dimer, Quant: 1.01 ug/mL-FEU — ABNORMAL HIGH (ref 0.00–0.50)

## 2020-01-12 LAB — TROPONIN I (HIGH SENSITIVITY): Troponin I (High Sensitivity): <2 ng/L (ref <18)

## 2020-01-12 LAB — LACTATE DEHYDROGENASE: LDH: 278 U/L — ABNORMAL HIGH (ref 98–192)

## 2020-01-12 LAB — RESP PANEL BY RT-PCR (FLU A&B, COVID) ARPGX2
Influenza A by PCR: NEGATIVE
Influenza B by PCR: NEGATIVE
SARS Coronavirus 2 by RT PCR: POSITIVE — AB

## 2020-01-12 LAB — PROCALCITONIN: Procalcitonin: <0.1 ng/mL

## 2020-01-12 LAB — C-REACTIVE PROTEIN: CRP: 14 mg/dL — ABNORMAL HIGH (ref <1.0)

## 2020-01-12 LAB — LACTIC ACID, PLASMA: Lactic Acid, Venous: 1.5 mmol/L (ref 0.5–1.9)

## 2020-01-12 LAB — FIBRINOGEN: Fibrinogen: 729 mg/dL — ABNORMAL HIGH (ref 210–475)

## 2020-01-12 LAB — FERRITIN: Ferritin: 368 ng/mL — ABNORMAL HIGH (ref 24–336)

## 2020-01-12 MED ORDER — SODIUM CHLORIDE 0.9 % IV SOLN
100.0000 mg | Freq: Every day | INTRAVENOUS | Status: AC
  Administered 2020-01-13 – 2020-01-16 (×4): 100 mg via INTRAVENOUS
  Filled 2020-01-12 (×4): qty 20

## 2020-01-12 MED ORDER — ASCORBIC ACID 500 MG PO TABS
500.0000 mg | ORAL_TABLET | Freq: Every day | ORAL | Status: DC
  Administered 2020-01-13 – 2020-01-17 (×5): 500 mg via ORAL
  Filled 2020-01-12 (×5): qty 1

## 2020-01-12 MED ORDER — HYDROCOD POLST-CPM POLST ER 10-8 MG/5ML PO SUER
5.0000 mL | Freq: Two times a day (BID) | ORAL | Status: DC | PRN
  Administered 2020-01-15: 5 mL via ORAL
  Filled 2020-01-12: qty 5

## 2020-01-12 MED ORDER — DEXAMETHASONE SODIUM PHOSPHATE 10 MG/ML IJ SOLN
6.0000 mg | Freq: Once | INTRAMUSCULAR | Status: AC
  Administered 2020-01-12: 6 mg via INTRAVENOUS
  Filled 2020-01-12: qty 1

## 2020-01-12 MED ORDER — ACETAMINOPHEN 325 MG PO TABS
650.0000 mg | ORAL_TABLET | Freq: Once | ORAL | Status: AC
  Administered 2020-01-12: 650 mg via ORAL
  Filled 2020-01-12: qty 2

## 2020-01-12 MED ORDER — POTASSIUM CHLORIDE CRYS ER 20 MEQ PO TBCR
40.0000 meq | EXTENDED_RELEASE_TABLET | Freq: Once | ORAL | Status: AC
  Administered 2020-01-12: 40 meq via ORAL
  Filled 2020-01-12: qty 2

## 2020-01-12 MED ORDER — ENOXAPARIN SODIUM 40 MG/0.4ML ~~LOC~~ SOLN
40.0000 mg | SUBCUTANEOUS | Status: DC
  Administered 2020-01-12 – 2020-01-16 (×5): 40 mg via SUBCUTANEOUS
  Filled 2020-01-12 (×5): qty 0.4

## 2020-01-12 MED ORDER — PREDNISONE 50 MG PO TABS: 50.0000 mg | ORAL_TABLET | Freq: Every day | ORAL | Status: DC

## 2020-01-12 MED ORDER — ZINC SULFATE 220 (50 ZN) MG PO CAPS
220.0000 mg | ORAL_CAPSULE | Freq: Every day | ORAL | Status: DC
  Administered 2020-01-12 – 2020-01-17 (×6): 220 mg via ORAL
  Filled 2020-01-12 (×6): qty 1

## 2020-01-12 MED ORDER — METHYLPREDNISOLONE SODIUM SUCC 40 MG IJ SOLR
0.5000 mg/kg | Freq: Two times a day (BID) | INTRAMUSCULAR | Status: AC
  Administered 2020-01-13 – 2020-01-15 (×6): 36.4 mg via INTRAVENOUS
  Filled 2020-01-12 (×6): qty 1

## 2020-01-12 MED ORDER — ALBUTEROL SULFATE HFA 108 (90 BASE) MCG/ACT IN AERS: 2.0000 | INHALATION_SPRAY | RESPIRATORY_TRACT | Status: DC | PRN

## 2020-01-12 MED ORDER — GUAIFENESIN-DM 100-10 MG/5ML PO SYRP: 10.0000 mL | ORAL_SOLUTION | ORAL | Status: DC | PRN

## 2020-01-12 MED ORDER — SODIUM CHLORIDE 0.9 % IV SOLN
200.0000 mg | Freq: Once | INTRAVENOUS | Status: AC
  Administered 2020-01-12: 200 mg via INTRAVENOUS
  Filled 2020-01-12: qty 40

## 2020-01-12 MED ORDER — VITAMIN D 25 MCG (1000 UNIT) PO TABS
1000.0000 [IU] | ORAL_TABLET | Freq: Every day | ORAL | Status: DC
  Administered 2020-01-13 – 2020-01-17 (×5): 1000 [IU] via ORAL
  Filled 2020-01-12 (×5): qty 1

## 2020-01-12 MED ORDER — ACETAMINOPHEN 325 MG PO TABS
650.0000 mg | ORAL_TABLET | Freq: Four times a day (QID) | ORAL | Status: DC | PRN
  Administered 2020-01-13: 650 mg via ORAL
  Filled 2020-01-12: qty 2

## 2020-01-12 NOTE — ED Triage Notes (Signed)
Pt arrived via walk in, tested (+) COVID 11/16, c/o cough, headache, body aches, SOB.

## 2020-01-12 NOTE — Plan of Care (Signed)

## 2020-01-12 NOTE — H&P (Signed)
History and Physical    Bruce Arnold YOV:[id number] DOB: 07-19-1970 DOA: 01/12/2020  PCP: Patient, No Pcp Per Patient coming from: Home  Chief Complaint: Shortness of breath  HPI: Bruce Arnold is a 49 y.o. male with a past medical history of alcohol use/binge drinking, not vaccinated against Covid presenting with complaints of shortness of breath.  Spanish interpreter services used.  Patient states he tested positive for Covid at [facility] on 01/04/2020.  His symptoms started the same day and included fevers, body aches, shortness of breath, and abdominal pain.  Denies cough or chest pain.  States most of his symptoms resolved except shortness of breath and abdominal pain have persisted.  Patient states his abdominal pain has now resolved since he has been in the ED.  No nausea, vomiting, or diarrhea.  Patient reports drinking a 12 pack of beer once a month.  States he drinks the entire 12 pack all at once.  States he last consumed alcohol over 2 weeks ago.  ED Course: Febrile with temperature 100.8 F.  Tachycardic and tachypneic.  Not hypotensive.  SPO2 as low as 88% on room air, improved with 2 L supplemental oxygen.  WBC 5.2, hemoglobin 16.6, hematocrit 46.8, platelet 143K.  Sodium 133, potassium 3.4, chloride 95, bicarb 25, BUN 10, creatinine 0.8, glucose 198.  AST 43, ALT 36, alk phos 68, T bili 0.9.  Lactic acid 1.5.  Blood culture x2 pending.  Procalcitonin level pending.  Inflammatory markers elevated: D-dimer 1.0, LDH 278, ferritin 368, fibrinogen 729, and CRP 14.0.  Repeat SARS-CoV-2 PCR test and influenza panel pending.  Chest x-ray showing bibasilar patchy opacities, worse on the left than the right, suspicious for multifocal pneumonia given history of COVID-19 infection.  Patient received IV Decadron 6 mg, remdesivir, and Tylenol.  Review of Systems:  All systems reviewed and apart from history of presenting illness, are negative.  History reviewed. No pertinent past medical  history.  History reviewed. No pertinent surgical history.   reports that he has never smoked. He has never used smokeless tobacco. He reports current alcohol use. He reports that he does not use drugs.  No Known Allergies  Family History  Problem Relation Age of Onset  . Diabetes Mother   . Hypertension Mother     Prior to Admission medications   Medication Sig Start Date End Date Taking? Authorizing Provider  ciprofloxacin (CIPRO) 500 MG tablet Take 1 tablet (500 mg total) by mouth 2 (two) times daily. One po bid x 7 days 08/10/13   Ripley Fraise, MD  metroNIDAZOLE (FLAGYL) 500 MG tablet Take 1 tablet (500 mg total) by mouth 2 (two) times daily. One po bid x 7 days 08/10/13   Ripley Fraise, MD  oxyCODONE-acetaminophen (PERCOCET/ROXICET) 5-325 MG per tablet Take 2 tablets by mouth every 4 (four) hours as needed for severe pain. 08/10/13   Ripley Fraise, MD    Physical Exam: Vitals:   01/12/20 1845 01/12/20 2005 01/12/20 2007 01/12/20 2045  BP: (!) 141/98 (!) 148/90  (!) 145/93  Pulse: 99 92  86  Resp: (!) 31 (!) 32  (!) 25  Temp:   99.7 F (37.6 C)   TempSrc:   Oral   SpO2: 93% 94%  95%  Weight:      Height:        Physical Exam Constitutional:      General: He is not in acute distress. HENT:     Head: Normocephalic and atraumatic.     Mouth/Throat:  Mouth: Mucous membranes are moist.     Pharynx: Oropharynx is clear.  Eyes:     Extraocular Movements: Extraocular movements intact.     Conjunctiva/sclera: Conjunctivae normal.  Cardiovascular:     Rate and Rhythm: Normal rate and regular rhythm.     Pulses: Normal pulses.  Pulmonary:     Breath sounds: Rales present. No wheezing.     Comments: Tachypneic with respiratory rate up to 30 Bibasilar rales Satting in the mid 90s on 2 L supplemental oxygen via nasal cannula Abdominal:     General: Bowel sounds are normal.     Palpations: Abdomen is soft.     Tenderness: There is no abdominal tenderness. There  is no guarding or rebound.  Musculoskeletal:        General: No swelling or tenderness.     Cervical back: Normal range of motion and neck supple.  Skin:    General: Skin is warm and dry.  Neurological:     General: No focal deficit present.     Mental Status: He is alert and oriented to person, place, and time.     Labs on Admission: I have personally reviewed following labs and imaging studies  CBC: Recent Labs  Lab 01/12/20 1739  WBC 5.2  NEUTROABS 4.4  HGB 16.6  HCT 46.8  MCV 86.0  PLT 448*   Basic Metabolic Panel: Recent Labs  Lab 01/12/20 1739  NA 133*  K 3.4*  CL 95*  CO2 25  GLUCOSE 198*  BUN 10  CREATININE 0.87  CALCIUM 8.6*   GFR: Estimated Creatinine Clearance: 86.8 mL/min (by C-G formula based on SCr of 0.87 mg/dL). Liver Function Tests: Recent Labs  Lab 01/12/20 1739  AST 43*  ALT 36  ALKPHOS 68  BILITOT 0.9  PROT 8.2*  ALBUMIN 3.9   No results for input(s): LIPASE, AMYLASE in the last 168 hours. No results for input(s): AMMONIA in the last 168 hours. Coagulation Profile: No results for input(s): INR, PROTIME in the last 168 hours. Cardiac Enzymes: No results for input(s): CKTOTAL, CKMB, CKMBINDEX, TROPONINI in the last 168 hours. BNP (last 3 results) No results for input(s): PROBNP in the last 8760 hours. HbA1C: No results for input(s): HGBA1C in the last 72 hours. CBG: No results for input(s): GLUCAP in the last 168 hours. Lipid Profile: Recent Labs    01/12/20 1739  TRIG 69   Thyroid Function Tests: No results for input(s): TSH, T4TOTAL, FREET4, T3FREE, THYROIDAB in the last 72 hours. Anemia Panel: Recent Labs    01/12/20 1739  FERRITIN 368*   Urine analysis: No results found for: COLORURINE, APPEARANCEUR, LABSPEC, PHURINE, GLUCOSEU, HGBUR, BILIRUBINUR, KETONESUR, PROTEINUR, UROBILINOGEN, NITRITE, LEUKOCYTESUR  Radiological Exams on Admission: DG Chest 1 View  Result Date: 01/12/2020 CLINICAL DATA:  Shortness of  breath, COVID positive EXAM: CHEST  1 VIEW COMPARISON:  No previous chest imaging for comparison FINDINGS: Lung volumes are low. Cardiomediastinal contours accentuated by low lung volumes. Patchy opacities bilaterally at the lung bases worse on the LEFT than the RIGHT. No lobar consolidative changes. No sign of pleural effusion. On limited assessment no acute skeletal process. IMPRESSION: Low lung volumes with patchy opacities at the lung bases, worse on the LEFT than the RIGHT, likely reflecting multifocal pneumonia in this patient with history of COVID-19 infection. Concomitant pulmonary edema is possible given peribronchial cuffing and the increased interstitial markings. Electronically Signed   By: Zetta Bills M.D.   On: 01/12/2020 18:03    EKG:  Independently reviewed.  Sinus rhythm, T wave abnormality in inferior and lateral leads.  No prior tracing for comparison.  Assessment/Plan Principal Problem:   Pneumonia due to COVID-19 virus Active Problems:   Acute hypoxemic respiratory failure (HCC)   Sepsis (HCC)   Hypokalemia   Thrombocytopenia (HCC)   Acute hypoxemic respiratory failure Sepsis Suspected COVID-19 viral multifocal pneumonia Febrile with temperature 100.8 F.  Tachycardic and tachypneic.  Patient reports testing positive for COVID-19 at [facility] on 01/04/2020.  He has not been vaccinated against Covid.  Chest x-ray showing bibasilar patchy opacities, worse on the left than the right, suspicious for multifocal pneumonia. Inflammatory markers elevated: D-dimer 1.0, LDH 278, ferritin 368, fibrinogen 729, and CRP 14.0. SPO2 as low as 88% on room air, improved with 2 L supplemental oxygen. -Remdesivir -IV Solu-Medrol 0.5 mg/kg every 12 hours -Vitamin C, zinc, vitamin D -Antitussives as needed -Tylenol as needed -Bronchodilator as needed -Procalcitonin level pending -Daily CBC with differential, CMP, CRP, D-dimer -Airborne and contact precautions -Incentive spirometry,  flutter valve -Encourage prone positioning -Continuous pulse ox -Supplemental oxygen as needed to keep oxygen saturation above 90% -Blood culture x2 pending  Mild thrombocytopenia: Likely secondary to acute viral illness. -Continue to monitor  Mild hypokalemia: Likely due to poor oral intake in the setting of acute viral illness. -Replete potassium and continue to monitor  Abnormal EKG: EKG showing T wave abnormality in inferior and lateral leads.  No prior tracing for comparison.  Patient is not endorsing chest pain. -Cardiac monitoring.  Check high-sensitivity troponin level and order echocardiogram.  Alcohol use/binge drinking: Patient reports drinking a 12 pack of beer once a month.  States he drinks the entire 12 pack all at once.  States he last consumed alcohol over 2 weeks ago.  No signs of acute alcohol withdrawal at this time. -CIWA monitoring  DVT prophylaxis: Lovenox Code Status: Full code Family Communication: No family available at this time. Disposition Plan: Status is: Inpatient  Remains inpatient appropriate because:IV treatments appropriate due to intensity of illness or inability to take PO, Inpatient level of care appropriate due to severity of illness and New supplemental oxygen requirement   Dispo: The patient is from: Home              Anticipated d/c is to: Home              Anticipated d/c date is: 3 days              Patient currently is not medically stable to d/c.  The medical decision making on this patient was of high complexity and the patient is at high risk for clinical deterioration, therefore this is a level 3 visit.  Shela Leff MD [facility]  If 7PM-7AM, please contact night-coverage [email or web address]  01/12/2020, 9:06 PM

## 2020-01-12 NOTE — ED Notes (Signed)
This RN transported pt to room 1442 with Joy, EMT and upon entering room- pt took off his O2 and stated he was walking to bathroom. Pt did not give RN a chance to get him a urinal. Pt on 1L O2 at the time. Pt walked to bathroom with steady gait. Floor nurse aware and entering room as patient was in bathroom.

## 2020-01-12 NOTE — ED Provider Notes (Signed)
[facility]-EMERGENCY DEPT Provider Note   CSN: [id number] Arrival date & time: 01/12/20  1640     History Chief Complaint  Patient presents with  . Cough  . Shortness of Breath  . Covid Positive    Bruce Arnold is a 49 y.o. male with no known past medical history.  Did not have Covid vaccinations.  HPI Patient presents today to emergency department with chief complaint of progressively worsening nonproductive cough, body aches, shortness of breath x8 days.  Bruce Arnold states Bruce Arnold tested positive for Covid at a [facility] on 01/04/2020.  Bruce Arnold has had intermittent fevers at home, last one was yesterday and it was 101.  Bruce Arnold has been taking Tylenol and ibuprofen.  Last dose of ibuprofen was last night.  Bruce Arnold states Bruce Arnold has no appetite and is trying to stay hydrated by drinking fluids.  Bruce Arnold has had decreased p.o. intake secondary to symptoms.  Bruce Arnold thought by now his symptoms should be improving instead Bruce Arnold feels to be getting worse.  Bruce Arnold is also endorsing diffuse abdominal pain, Bruce Arnold states that is been going on for the last 4 days.  Bruce Arnold describes the abdominal pain as a soreness.  Pain does not radiate to his back or groin.  Bruce Arnold denies chills, congestion, chest pain, back pain, urinary frequency, dysuria, gross hematuria, diarrhea.  Due to language barrier, a video interpreter was present during the history-taking and subsequent discussion (and for part of the physical exam) with this patient.     History reviewed. No pertinent past medical history.  There are no problems to display for this patient.   History reviewed. No pertinent surgical history.     History reviewed. No pertinent family history.  Social History   Tobacco Use  . Smoking status: Never Smoker  . Smokeless tobacco: Never Used  Substance Use Topics  . Alcohol use: Yes    Comment: occasionaly  . Drug use: No    Home Medications Prior to Admission medications   Medication Sig Start Date End Date Taking?  Authorizing Provider  ciprofloxacin (CIPRO) 500 MG tablet Take 1 tablet (500 mg total) by mouth 2 (two) times daily. One po bid x 7 days 08/10/13   Zadie RhineWickline, Donald, MD  metroNIDAZOLE (FLAGYL) 500 MG tablet Take 1 tablet (500 mg total) by mouth 2 (two) times daily. One po bid x 7 days 08/10/13   Zadie RhineWickline, Donald, MD  oxyCODONE-acetaminophen (PERCOCET/ROXICET) 5-325 MG per tablet Take 2 tablets by mouth every 4 (four) hours as needed for severe pain. 08/10/13   Zadie RhineWickline, Donald, MD    Allergies    Patient has no known allergies.  Review of Systems   Review of Systems All other systems are reviewed and are negative for acute change except as noted in the HPI.  Physical Exam Updated Vital Signs BP (!) 159/91 (BP Location: Left Arm)   Pulse 94   Temp (!) 100.8 F (38.2 C) (Oral)   Resp (!) 35   Ht 5\' 1"  (1.549 m)   Wt 72.6 kg   SpO2 (!) 89%   BMI 30.23 kg/m   Physical Exam Vitals and nursing note reviewed.  Constitutional:      General: Bruce Arnold is not in acute distress.    Appearance: Bruce Arnold is ill-appearing. Bruce Arnold is not toxic-appearing.  HENT:     Head: Normocephalic and atraumatic.     Right Ear: Tympanic membrane and external ear normal.     Left Ear: Tympanic membrane and external ear normal.  Nose: Nose normal.     Mouth/Throat:     Mouth: Mucous membranes are dry.     Pharynx: Oropharynx is clear.  Eyes:     General: No scleral icterus.       Right eye: No discharge.        Left eye: No discharge.     Extraocular Movements: Extraocular movements intact.     Conjunctiva/sclera: Conjunctivae normal.     Pupils: Pupils are equal, round, and reactive to light.  Neck:     Vascular: No JVD.  Cardiovascular:     Rate and Rhythm: Normal rate and regular rhythm.     Pulses: Normal pulses.          Radial pulses are 2+ on the right side and 2+ on the left side.     Heart sounds: Normal heart sounds.  Pulmonary:     Comments: Tachypneic.  Lung sounds diminished throughout..  Symmetric chest rise. No wheezing, rales, or rhonchi.  Talking in short sentences.  No nasal flaring or accessory muscle use. Abdominal:     Comments: Abdomen is soft, non-distended.  Diffuse tenderness, no rigidity, no guarding. No peritoneal signs.  Musculoskeletal:        General: Normal range of motion.     Cervical back: Normal range of motion.  Skin:    General: Skin is warm and dry.     Capillary Refill: Capillary refill takes less than 2 seconds.  Neurological:     Mental Status: Bruce Arnold is oriented to person, place, and time.     GCS: GCS eye subscore is 4. GCS verbal subscore is 5. GCS motor subscore is 6.     Comments: Fluent speech, no facial droop.  Psychiatric:        Behavior: Behavior normal.     ED Results / Procedures / Treatments   Labs (all labs ordered are listed, but only abnormal results are displayed) Labs Reviewed  CBC WITH DIFFERENTIAL/PLATELET - Abnormal; Notable for the following components:      Result Value   Platelets 143 (*)    Lymphs Abs 0.6 (*)    All other components within normal limits  COMPREHENSIVE METABOLIC PANEL - Abnormal; Notable for the following components:   Sodium 133 (*)    Potassium 3.4 (*)    Chloride 95 (*)    Glucose, Bld 198 (*)    Calcium 8.6 (*)    Total Protein 8.2 (*)    AST 43 (*)    All other components within normal limits  D-DIMER, QUANTITATIVE (NOT AT [facility]) - Abnormal; Notable for the following components:   D-Dimer, Quant 1.01 (*)    All other components within normal limits  LACTATE DEHYDROGENASE - Abnormal; Notable for the following components:   LDH 278 (*)    All other components within normal limits  FIBRINOGEN - Abnormal; Notable for the following components:   Fibrinogen 729 (*)    All other components within normal limits  CULTURE, BLOOD (ROUTINE X 2)  CULTURE, BLOOD (ROUTINE X 2)  RESP PANEL BY RT-PCR (FLU A&B, COVID) ARPGX2  LACTIC ACID, PLASMA  TRIGLYCERIDES  PROCALCITONIN  FERRITIN  C-REACTIVE  PROTEIN    EKG EKG interpretation  Date/Time:  12-Jan-2020   16:54:33 Ventricular Rate: 96 bpm PR Interval: 142 ms QT: 320 QTC Calculation: 405 P R T axis: 36   54   -68 Text interpretation: Sinus rhythm, Nonspecific T abnormalities. No STEMI. Signed by Benjiman Core at  18:56  Radiology DG Chest 1 View  Result Date: 01/12/2020 CLINICAL DATA:  Shortness of breath, COVID positive EXAM: CHEST  1 VIEW COMPARISON:  No previous chest imaging for comparison FINDINGS: Lung volumes are low. Cardiomediastinal contours accentuated by low lung volumes. Patchy opacities bilaterally at the lung bases worse on the LEFT than the RIGHT. No lobar consolidative changes. No sign of pleural effusion. On limited assessment no acute skeletal process. IMPRESSION: Low lung volumes with patchy opacities at the lung bases, worse on the LEFT than the RIGHT, likely reflecting multifocal pneumonia in this patient with history of COVID-19 infection. Concomitant pulmonary edema is possible given peribronchial cuffing and the increased interstitial markings. Electronically Signed   By: Donzetta Kohut M.D.   On: 01/12/2020 18:03    Procedures Procedures (including critical care time)  Medications Ordered in ED Medications  remdesivir 200 mg in sodium chloride 0.9% 250 mL IVPB (has no administration in time range)    Followed by  remdesivir 100 mg in sodium chloride 0.9 % 100 mL IVPB (has no administration in time range)  acetaminophen (TYLENOL) tablet 650 mg (650 mg Oral Given 01/12/20 1815)  dexamethasone (DECADRON) injection 6 mg (6 mg Intravenous Given 01/12/20 1816)    ED Course  I have reviewed the triage vital signs and the nursing notes.  Pertinent labs & imaging results that were available during my care of the patient were reviewed by me and considered in my medical decision making (see chart for details).  Vitals:   01/12/20 1717 01/12/20 1728 01/12/20 1830 01/12/20 1845  BP:  (!) 159/91 (!)  158/91 (!) 141/98  Pulse:  94 (!) 101 99  Resp: (!) 26 (!) 35 (!) 37 (!) 31  Temp:  (!) 100.8 F (38.2 C)    TempSrc:  Oral    SpO2:  (!) 89% 94% 93%  Weight:  72.6 kg    Height:  5\' 1"  (1.549 m)        MDM Rules/Calculators/A&P                          History provided by patient with additional history obtained from chart review.    49 year old male presenting with covid symptoms.  This is day 8 of his symptoms.  Bruce Arnold is ill-appearing ever not toxic.  Bruce Arnold has increased work of breathing, tachypnea with a rate in the mid 30s.  Bruce Arnold is hypoxic on room air to 88%.  Placed on 2 L nasal cannula with improvement to 94%.  Bruce Arnold has diffuse abdominal tenderness, no focal findings.  Bruce Arnold is febrile to 100.8, Tylenol given.  EKG without STEMI.  Chest x-ray viewed by me is concerning for Covid pneumonia.  Decadron and remdesivir ordered.  Suspect Covid admission labs obtained and show no leukocytosis, no anemia, no significant electrolyte derangement, does have hyperglycemia with glucose of 198 and no known diabetes diagnosis, no renal insufficiency.  D-dimer is elevated at 1.01 LDH and fibrinogen also elevated.  Lactic acid within normal range.  Triglycerides normal.  Patient did not have proof of his Covid test therefore Bruce Arnold was swabbed here.  Swab is in process.  On reassessment Bruce Arnold is afebrile.  Spoke with Dr. 44 with hospitalist service who agrees to assume care of patient and bring into the hospital for further evaluation and management.     Altair Appenzeller was evaluated in Emergency Department on 01/12/2020 for the symptoms described in the history of present illness. Bruce Arnold was evaluated  in the context of the global COVID-19 pandemic, which necessitated consideration that the patient might be at risk for infection with the SARS-CoV-2 virus that causes COVID-19. Institutional protocols and algorithms that pertain to the evaluation of patients at risk for COVID-19 are in a state of rapid change based on  information released by regulatory bodies including the CDC and federal and state organizations. These policies and algorithms were followed during the patient's care in the ED.   Portions of this note were generated with Scientist, clinical (histocompatibility and immunogenetics). Dictation errors may occur despite best attempts at proofreading.    Final Clinical Impression(s) / ED Diagnoses Final diagnoses:  Pneumonia due to COVID-19 virus  Hypoxia    Rx / DC Orders ED Discharge Orders    None       Kandice Hams 01/12/20 2049    Benjiman Core, MD 01/13/20 [phone]

## 2020-01-13 ENCOUNTER — Inpatient Hospital Stay (HOSPITAL_COMMUNITY): Payer: BC Managed Care – PPO

## 2020-01-13 DIAGNOSIS — I340 Nonrheumatic mitral (valve) insufficiency: Secondary | ICD-10-CM

## 2020-01-13 DIAGNOSIS — R0902 Hypoxemia: Secondary | ICD-10-CM

## 2020-01-13 DIAGNOSIS — J9601 Acute respiratory failure with hypoxia: Secondary | ICD-10-CM

## 2020-01-13 DIAGNOSIS — R9431 Abnormal electrocardiogram [ECG] [EKG]: Secondary | ICD-10-CM

## 2020-01-13 LAB — ECHOCARDIOGRAM COMPLETE
Area-P 1/2: 2.79 cm2
Height: 61 in
S' Lateral: 2.6 cm
Weight: 2563.2 oz

## 2020-01-13 LAB — COMPREHENSIVE METABOLIC PANEL
ALT: 32 U/L (ref 0–44)
AST: 36 U/L (ref 15–41)
Albumin: 3.3 g/dL — ABNORMAL LOW (ref 3.5–5.0)
Alkaline Phosphatase: 59 U/L (ref 38–126)
Anion gap: 10 (ref 5–15)
BUN: 16 mg/dL (ref 6–20)
CO2: 24 mmol/L (ref 22–32)
Calcium: 8.5 mg/dL — ABNORMAL LOW (ref 8.9–10.3)
Chloride: 100 mmol/L (ref 98–111)
Creatinine, Ser: 0.82 mg/dL (ref 0.61–1.24)
GFR, Estimated: >60 mL/min (ref >60)
Glucose, Bld: 268 mg/dL — ABNORMAL HIGH (ref 70–99)
Potassium: 4.3 mmol/L (ref 3.5–5.1)
Sodium: 134 mmol/L — ABNORMAL LOW (ref 135–145)
Total Bilirubin: 0.8 mg/dL (ref 0.3–1.2)
Total Protein: 7.3 g/dL (ref 6.5–8.1)

## 2020-01-13 LAB — CBC WITH DIFFERENTIAL/PLATELET
Abs Immature Granulocytes: 0.01 10*3/uL (ref 0.00–0.07)
Basophils Absolute: 0 10*3/uL (ref 0.0–0.1)
Basophils Relative: 0 %
Eosinophils Absolute: 0 10*3/uL (ref 0.0–0.5)
Eosinophils Relative: 0 %
HCT: 45.1 % (ref 39.0–52.0)
Hemoglobin: 15.8 g/dL (ref 13.0–17.0)
Immature Granulocytes: 0 %
Lymphocytes Relative: 10 %
Lymphs Abs: 0.5 10*3/uL — ABNORMAL LOW (ref 0.7–4.0)
MCH: 30.3 pg (ref 26.0–34.0)
MCHC: 35 g/dL (ref 30.0–36.0)
MCV: 86.4 fL (ref 80.0–100.0)
Monocytes Absolute: 0.2 10*3/uL (ref 0.1–1.0)
Monocytes Relative: 3 %
Neutro Abs: 4.1 10*3/uL (ref 1.7–7.7)
Neutrophils Relative %: 87 %
Platelets: 141 10*3/uL — ABNORMAL LOW (ref 150–400)
RBC: 5.22 MIL/uL (ref 4.22–5.81)
RDW: 12 % (ref 11.5–15.5)
WBC: 4.7 10*3/uL (ref 4.0–10.5)
nRBC: 0 % (ref 0.0–0.2)

## 2020-01-13 LAB — GLUCOSE, CAPILLARY
Glucose-Capillary: 379 mg/dL — ABNORMAL HIGH (ref 70–99)
Glucose-Capillary: 325 mg/dL — ABNORMAL HIGH (ref 70–99)
Glucose-Capillary: 302 mg/dL — ABNORMAL HIGH (ref 70–99)

## 2020-01-13 LAB — D-DIMER, QUANTITATIVE: D-Dimer, Quant: 0.78 ug/mL-FEU — ABNORMAL HIGH (ref 0.00–0.50)

## 2020-01-13 LAB — HIV ANTIBODY (ROUTINE TESTING W REFLEX): HIV Screen 4th Generation wRfx: NONREACTIVE

## 2020-01-13 LAB — C-REACTIVE PROTEIN: CRP: 14 mg/dL — ABNORMAL HIGH (ref <1.0)

## 2020-01-13 MED ORDER — ORAL CARE MOUTH RINSE
15.0000 mL | Freq: Two times a day (BID) | OROMUCOSAL | Status: DC
  Administered 2020-01-13 – 2020-01-15 (×6): 15 mL via OROMUCOSAL

## 2020-01-13 MED ORDER — CHLORHEXIDINE GLUCONATE 0.12 % MT SOLN
15.0000 mL | Freq: Two times a day (BID) | OROMUCOSAL | Status: DC
  Administered 2020-01-13 – 2020-01-16 (×7): 15 mL via OROMUCOSAL
  Filled 2020-01-13 (×7): qty 15

## 2020-01-13 MED ORDER — INSULIN ASPART 100 UNIT/ML ~~LOC~~ SOLN
0.0000 [IU] | Freq: Three times a day (TID) | SUBCUTANEOUS | Status: DC
  Administered 2020-01-13: 9 [IU] via SUBCUTANEOUS
  Administered 2020-01-13: 7 [IU] via SUBCUTANEOUS
  Administered 2020-01-14: 5 [IU] via SUBCUTANEOUS
  Administered 2020-01-14: 3 [IU] via SUBCUTANEOUS
  Administered 2020-01-14: 7 [IU] via SUBCUTANEOUS
  Administered 2020-01-15: 9 [IU] via SUBCUTANEOUS
  Administered 2020-01-15: 5 [IU] via SUBCUTANEOUS
  Administered 2020-01-15: 7 [IU] via SUBCUTANEOUS
  Administered 2020-01-16: 5 [IU] via SUBCUTANEOUS
  Administered 2020-01-16 (×2): 9 [IU] via SUBCUTANEOUS
  Administered 2020-01-17: 7 [IU] via SUBCUTANEOUS
  Administered 2020-01-17: 3 [IU] via SUBCUTANEOUS

## 2020-01-13 NOTE — Progress Notes (Signed)
  Echocardiogram 2D Echocardiogram has been performed.  Leta Jungling M 01/13/2020, 11:45 AM

## 2020-01-13 NOTE — Progress Notes (Addendum)
PROGRESS NOTE  Nagi Furio NWG:[id number] DOB: [birth year] DOA: 01/12/2020 PCP: Patient, No Pcp Per   LOS: 1 day   Brief Narrative / Interim history: 49 year old male with no significant past medical history other than EtOH abuse/binge drinking, came into the hospital with shortness of breath.  He tested positive for Covid at [facility] on 11/16, he was having generalized weakness, fevers, body aches, and had progressive shortness of breath for which he came to the hospital.  Chest x-ray showed bibasilar patchy opacities, worse on the left than the right, he was found to be hypoxic requiring 2 L of oxygen and was admitted to the hospital.  Subjective / 24h Interval events: Weakness is improved some but still remains very short of breath with minimal activities.  Assessment & Plan:  Principal Problem Acute Hypoxic Respiratory Failure due to Covid-19 Viral Illness sepsis due to viral illness -Continue remdesivir, steroids, patient is only requiring 2 L nasal cannula.  Consented for Actemra/baricitinib if needed later on if he is oxygen requirements get worse -Incentive spirometry, flutter valve, early ambulation, proning as able -D-dimer stable, CRP elevated but stable   COVID-19 Labs  Recent Labs    01/12/20 1739 01/13/20 0420  DDIMER 1.01* 0.78*  FERRITIN 368*  --   LDH 278*  --   CRP 14.0* 14.0*    Lab Results  Component Value Date   SARSCOV2NAA POSITIVE (A) 01/12/2020    Active Problems Alcohol abuse -Patient is not a daily drinker and tells me he drinks maybe every other week but when he does usually goes through 12 pack of beer.  Occasionally he drinks tequila discharge with dinner.  Monitor closely, low threshold to start CIWA  Obesity -Based on BMI of 30.2, patient will benefit from weight loss  Hyperglycemia -Glucose 268 on morning labs.  Add sliding scale  Thrombocytopenia -Mild, possibly due to EtOH  Scheduled Meds: . vitamin C  500 mg Oral Daily  .  chlorhexidine  15 mL Mouth Rinse BID  . cholecalciferol  1,000 Units Oral Daily  . enoxaparin (LOVENOX) injection  40 mg Subcutaneous Q24H  . mouth rinse  15 mL Mouth Rinse q12n4p  . methylPREDNISolone (SOLU-MEDROL) injection  0.5 mg/kg Intravenous Q12H   Followed by  . [START ON 01/16/2020] predniSONE  50 mg Oral Daily  . zinc sulfate  220 mg Oral Daily   Continuous Infusions: . remdesivir 100 mg in NS 100 mL 100 mg (01/13/20 0936)   PRN Meds:.acetaminophen, albuterol, chlorpheniramine-HYDROcodone, guaiFENesin-dextromethorphan  DVT prophylaxis: Lovenox Code Status: Full code Family Communication: no family at bedside    Status is: Inpatient  Remains inpatient appropriate because:Inpatient level of care appropriate due to severity of illness   Dispo: The patient is from: Home              Anticipated d/c is to: Home              Anticipated d/c date is: 3 days              Patient currently is not medically stable to d/c.   Consultants:  None   Procedures:  None   Microbiology: None   Antibacterials: None    Objective: Vitals:   01/12/20 2230 01/13/20 0300 01/13/20 0609 01/13/20 1002  BP: (!) 143/81 124/88 119/82 134/85  Pulse: 87 71 81 87  Resp: (!) 22  20 (!) 24  Temp: 99.3 F (37.4 C) 97.8 F (36.6 C) 98 F (36.7 C) 99.2 F (37.3 C)  TempSrc:  Oral Oral Oral  SpO2: 96% 95% 91% 94%  Weight: 72.7 kg     Height: 5\' 1"  (1.549 m)       Intake/Output Summary (Last 24 hours) at 01/13/2020 1025 Last data filed at 01/13/2020 0900 Gross per 24 hour  Intake --  Output 1100 ml  Net -1100 ml   Filed Weights   01/12/20 1728 01/12/20 2230  Weight: 72.6 kg 72.7 kg    Examination:  Constitutional: NAD Eyes: no scleral icterus ENMT: Mucous membranes are moist.  Neck: normal, supple Respiratory: Faint rhonchi at the bases, no wheezing, no crackles, moves air well Cardiovascular: Regular rate and rhythm, no murmurs / rubs / gallops. No LE edema.  Abdomen:  non distended, no tenderness. Bowel sounds positive.  Musculoskeletal: no clubbing / cyanosis.  Skin: no rashes Neurologic: CN 2-12 grossly intact. Strength 5/5 in all 4.   Data Reviewed: I have independently reviewed following labs and imaging studies   CBC: Recent Labs  Lab 01/12/20 1739 01/13/20 0420  WBC 5.2 4.7  NEUTROABS 4.4 4.1  HGB 16.6 15.8  HCT 46.8 45.1  MCV 86.0 86.4  PLT 143* 141*   Basic Metabolic Panel: Recent Labs  Lab 01/12/20 1739 01/13/20 0420  NA 133* 134*  K 3.4* 4.3  CL 95* 100  CO2 25 24  GLUCOSE 198* 268*  BUN 10 16  CREATININE 0.87 0.82  CALCIUM 8.6* 8.5*   GFR: Estimated Creatinine Clearance: 92.2 mL/min (by C-G formula based on SCr of 0.82 mg/dL). Liver Function Tests: Recent Labs  Lab 01/12/20 1739 01/13/20 0420  AST 43* 36  ALT 36 32  ALKPHOS 68 59  BILITOT 0.9 0.8  PROT 8.2* 7.3  ALBUMIN 3.9 3.3*   No results for input(s): LIPASE, AMYLASE in the last 168 hours. No results for input(s): AMMONIA in the last 168 hours. Coagulation Profile: No results for input(s): INR, PROTIME in the last 168 hours. Cardiac Enzymes: No results for input(s): CKTOTAL, CKMB, CKMBINDEX, TROPONINI in the last 168 hours. BNP (last 3 results) No results for input(s): PROBNP in the last 8760 hours. HbA1C: No results for input(s): HGBA1C in the last 72 hours. CBG: No results for input(s): GLUCAP in the last 168 hours. Lipid Profile: Recent Labs    01/12/20 1739  TRIG 69   Thyroid Function Tests: No results for input(s): TSH, T4TOTAL, FREET4, T3FREE, THYROIDAB in the last 72 hours. Anemia Panel: Recent Labs    01/12/20 1739  FERRITIN 368*   Urine analysis: No results found for: COLORURINE, APPEARANCEUR, LABSPEC, PHURINE, GLUCOSEU, HGBUR, BILIRUBINUR, KETONESUR, PROTEINUR, UROBILINOGEN, NITRITE, LEUKOCYTESUR Sepsis Labs: Invalid input(s): PROCALCITONIN, LACTICIDVEN  Recent Results (from the past 240 hour(s))  Blood Culture (routine x 2)      Status: None (Preliminary result)   Collection Time: 01/12/20  5:39 PM   Specimen: BLOOD  Result Value Ref Range Status   Specimen Description   Final    BLOOD LEFT ANTECUBITAL Performed at [facility], [address], [place], [place]    Special Requests   Final    BOTTLES DRAWN AEROBIC AND ANAEROBIC Blood Culture adequate volume Performed at [facility], [address] [place], [place]    Culture   Final    NO GROWTH < 12 HOURS Performed at [facility], [address], [place], [place]    Report Status PENDING  Incomplete  Resp Panel by RT-PCR (Flu A&B, Covid) Nasopharyngeal Swab  Status: Abnormal   Collection Time: 01/12/20  5:41 PM   Specimen: Nasopharyngeal Swab; Nasopharyngeal(NP) swabs in vial transport medium  Result Value Ref Range Status   SARS Coronavirus 2 by RT PCR POSITIVE (A) NEGATIVE Final    Comment: RESULT CALLED TO, READ BACK BY AND VERIFIED WITH: LYNCH J. 11.24.21 @ 2157 BY MECIAL J. (NOTE) SARS-CoV-2 target nucleic acids are DETECTED.  The SARS-CoV-2 RNA is generally detectable in upper respiratory specimens during the acute phase of infection. Positive results are indicative of the presence of the identified virus, but do not rule out bacterial infection or co-infection with other pathogens not detected by the test. Clinical correlation with patient history and other diagnostic information is necessary to determine patient infection status. The expected result is Negative.  Fact Sheet for Patients: BloggerCourse.com  Fact Sheet for Healthcare Providers: SeriousBroker.it  This test is not yet approved or cleared by the [place] FDA and  has been authorized for detection and/or diagnosis of SARS-CoV-2 by FDA under an Emergency Use Authorization (EUA).  This EUA will remain in effect (meaning this test ca n be used) for  the duration of  the COVID-19 declaration under Section 564(b)(1) of the Act, 21 U.S.C. section 360bbb-3(b)(1), unless the authorization is terminated or revoked sooner.     Influenza A by PCR NEGATIVE NEGATIVE Final   Influenza B by PCR NEGATIVE NEGATIVE Final    Comment: (NOTE) The Xpert Xpress SARS-CoV-2/FLU/RSV plus assay is intended as an aid in the diagnosis of influenza from Nasopharyngeal swab specimens and should not be used as a sole basis for treatment. Nasal washings and aspirates are unacceptable for Xpert Xpress SARS-CoV-2/FLU/RSV testing.  Fact Sheet for Patients: BloggerCourse.com  Fact Sheet for Healthcare Providers: SeriousBroker.it  This test is not yet approved or cleared by the [place] FDA and has been authorized for detection and/or diagnosis of SARS-CoV-2 by FDA under an Emergency Use Authorization (EUA). This EUA will remain in effect (meaning this test can be used) for the duration of the COVID-19 declaration under Section 564(b)(1) of the Act, 21 U.S.C. section 360bbb-3(b)(1), unless the authorization is terminated or revoked.  Performed at [facility], [address] [place], [place] [address]   Blood Culture (routine x 2)     Status: None (Preliminary result)   Collection Time: 01/12/20  5:44 PM   Specimen: BLOOD  Result Value Ref Range Status   Specimen Description   Final    BLOOD RIGHT ANTECUBITAL Performed at [facility], [address] [place], [place] [address]    Special Requests   Final    BOTTLES DRAWN AEROBIC AND ANAEROBIC Blood Culture adequate volume Performed at [facility], [address], [place], [place] [address]    Culture   Final    NO GROWTH < 12 HOURS Performed at [facility], [address], [place], [place] [address]    Report Status PENDING  Incomplete      Radiology Studies: DG Chest 1  View  Result Date: 01/12/2020 CLINICAL DATA:  Shortness of breath, COVID positive EXAM: CHEST  1 VIEW COMPARISON:  No previous chest imaging for comparison FINDINGS: Lung volumes are low. Cardiomediastinal contours accentuated by low lung volumes. Patchy opacities bilaterally at the lung bases worse on the LEFT than the RIGHT. No lobar consolidative changes. No sign of pleural effusion. On limited assessment no acute skeletal process. IMPRESSION: Low lung volumes with patchy opacities at the lung bases,  worse on the LEFT than the RIGHT, likely reflecting multifocal pneumonia in this patient with history of COVID-19 infection. Concomitant pulmonary edema is possible given peribronchial cuffing and the increased interstitial markings. Electronically Signed   By: Donzetta KohutGeoffrey  Wile M.D.   On: 01/12/2020 18:03     Pamella Pertostin Davinder Haff, MD, PhD [facility]  Between 7 am - 7 pm I am available, please contact me via Amion or Securechat  Between 7 pm - 7 am I am not available, please contact night coverage MD/APP via Amion

## 2020-01-13 NOTE — Plan of Care (Signed)

## 2020-01-14 DIAGNOSIS — E876 Hypokalemia: Secondary | ICD-10-CM

## 2020-01-14 LAB — CBC WITH DIFFERENTIAL/PLATELET
Abs Immature Granulocytes: 0.03 10*3/uL (ref 0.00–0.07)
Basophils Absolute: 0 10*3/uL (ref 0.0–0.1)
Basophils Relative: 0 %
Eosinophils Absolute: 0 10*3/uL (ref 0.0–0.5)
Eosinophils Relative: 0 %
HCT: 44.3 % (ref 39.0–52.0)
Hemoglobin: 15.4 g/dL (ref 13.0–17.0)
Immature Granulocytes: 1 %
Lymphocytes Relative: 12 %
Lymphs Abs: 0.5 10*3/uL — ABNORMAL LOW (ref 0.7–4.0)
MCH: 30.4 pg (ref 26.0–34.0)
MCHC: 34.8 g/dL (ref 30.0–36.0)
MCV: 87.5 fL (ref 80.0–100.0)
Monocytes Absolute: 0.2 10*3/uL (ref 0.1–1.0)
Monocytes Relative: 5 %
Neutro Abs: 3.6 10*3/uL (ref 1.7–7.7)
Neutrophils Relative %: 82 %
Platelets: 176 10*3/uL (ref 150–400)
RBC: 5.06 MIL/uL (ref 4.22–5.81)
RDW: 12 % (ref 11.5–15.5)
WBC: 4.4 10*3/uL (ref 4.0–10.5)
nRBC: 0 % (ref 0.0–0.2)

## 2020-01-14 LAB — COMPREHENSIVE METABOLIC PANEL
ALT: 30 U/L (ref 0–44)
AST: 32 U/L (ref 15–41)
Albumin: 3.1 g/dL — ABNORMAL LOW (ref 3.5–5.0)
Alkaline Phosphatase: 57 U/L (ref 38–126)
Anion gap: 10 (ref 5–15)
BUN: 20 mg/dL (ref 6–20)
CO2: 23 mmol/L (ref 22–32)
Calcium: 8.6 mg/dL — ABNORMAL LOW (ref 8.9–10.3)
Chloride: 100 mmol/L (ref 98–111)
Creatinine, Ser: 0.85 mg/dL (ref 0.61–1.24)
GFR, Estimated: >60 mL/min (ref >60)
Glucose, Bld: 295 mg/dL — ABNORMAL HIGH (ref 70–99)
Potassium: 4 mmol/L (ref 3.5–5.1)
Sodium: 133 mmol/L — ABNORMAL LOW (ref 135–145)
Total Bilirubin: 0.7 mg/dL (ref 0.3–1.2)
Total Protein: 7 g/dL (ref 6.5–8.1)

## 2020-01-14 LAB — HEMOGLOBIN A1C
Hgb A1c MFr Bld: 9.3 % — ABNORMAL HIGH (ref 4.8–5.6)
Mean Plasma Glucose: 220.21 mg/dL

## 2020-01-14 LAB — GLUCOSE, CAPILLARY
Glucose-Capillary: 240 mg/dL — ABNORMAL HIGH (ref 70–99)
Glucose-Capillary: 258 mg/dL — ABNORMAL HIGH (ref 70–99)
Glucose-Capillary: 337 mg/dL — ABNORMAL HIGH (ref 70–99)
Glucose-Capillary: 330 mg/dL — ABNORMAL HIGH (ref 70–99)

## 2020-01-14 LAB — D-DIMER, QUANTITATIVE: D-Dimer, Quant: 0.65 ug/mL-FEU — ABNORMAL HIGH (ref 0.00–0.50)

## 2020-01-14 LAB — C-REACTIVE PROTEIN: CRP: 7.4 mg/dL — ABNORMAL HIGH (ref <1.0)

## 2020-01-14 NOTE — Progress Notes (Signed)
Inpatient Diabetes Program Recommendations  AACE/ADA: New Consensus Statement on Inpatient Glycemic Control   Target Ranges:  Prepandial:   less than 140 mg/dL      Peak postprandial:   less than 180 mg/dL (1-2 hours)      Critically ill patients:  140 - 180 mg/dL   Results for ALAZAR, CHERIAN (MRN [id number]) as of 01/14/2020 08:39  Ref. Range 01/13/2020 11:25 01/13/2020 16:39 01/13/2020 20:58  Glucose-Capillary Latest Ref Range: 70 - 99 mg/dL 353 (H) 614 (H) 431 (H)  Results for YOSEPH, HAILE (MRN [id number]) as of 01/14/2020 08:39  Ref. Range 01/14/2020 04:43  Glucose Latest Ref Range: 70 - 99 mg/dL 950 (H)  Results for AKRAM, KISSICK (MRN [id number]) as of 01/14/2020 08:39  Ref. Range 01/14/2020 04:43  Hemoglobin A1C Latest Ref Range: 4.8 - 5.6 % 9.3 (H)   Review of Glycemic Control  Diabetes history: No Outpatient Diabetes medications: NA Current orders for Inpatient glycemic control: Novolog 0-9 units TID with meals; Solumedrol 36.4 mg Q12H, Prednisone 50 mg daily  Inpatient Diabetes Program Recommendations:    Insulin: If steroids are continued as ordered, please consider ordering Levemir 5 units BID, Novolog 4 units TID with meals for meal coverage if patient eats at least 50% of meals, and adding Novolog 0-5 units QHS for bedtime correction.  HbgA1C:  A1C 9.3% on 01/14/20 indicating an average glucose of 220 mg/dl over the past 2-3 months. Per ADA, if A1C is 6.5% or greater then mets criteria to dx with DM. MD, if patient will be newly dx with DM, please inform patient and bedside nursing staff so patient can be educated while inpatient. Also, please order consult for diabetes coordinator.  Thanks, Orlando Penner, RN, MSN, CDE Diabetes Coordinator Inpatient Diabetes Program [phone] (Team Pager from 8am to 5pm)

## 2020-01-14 NOTE — Plan of Care (Signed)

## 2020-01-14 NOTE — Progress Notes (Signed)
PROGRESS NOTE  Auther Lyerly JJO:[id number] DOB: 1970-12-08 DOA: 01/12/2020 PCP: Patient, No Pcp Per   LOS: 2 days   Brief Narrative / Interim history: 49 year old male with no significant past medical history other than EtOH abuse/binge drinking, came into the hospital with shortness of breath.  He tested positive for Covid at [facility] on 11/16, he was having generalized weakness, fevers, body aches, and had progressive shortness of breath for which he came to the hospital.  Chest x-ray showed bibasilar patchy opacities, worse on the left than the right, he was found to be hypoxic requiring 2 L of oxygen and was admitted to the hospital.  Subjective / 24h Interval events: Overall feels better.  Has not been walking at all, stayed in bed since yesterday.  He can take a deeper breath  Assessment & Plan:  Principal Problem Acute Hypoxic Respiratory Failure due to Covid-19 Viral Illness sepsis due to viral illness -Continue remdesivir, steroids, patient still on 2 L.  Consented for Actemra/baricitinib if needed later on if he is oxygen requirements get worse -Incentive spirometry, flutter valve, early ambulation, proning as able -D-dimer stable, CRP improving   COVID-19 Labs  Recent Labs    01/12/20 1739 01/13/20 0420 01/14/20 0443  DDIMER 1.01* 0.78* 0.65*  FERRITIN 368*  --   --   LDH 278*  --   --   CRP 14.0* 14.0* 7.4*    Lab Results  Component Value Date   SARSCOV2NAA POSITIVE (A) 01/12/2020    Active Problems Alcohol abuse -Patient is not a daily drinker and tells me he drinks maybe every other week but when he does usually goes through 12 pack of beer.  Occasionally he drinks tequila discharge with dinner.  Not triggering CIWA  Obesity -Based on BMI of 30.2, patient will benefit from weight loss  Hyperglycemia -Continue sliding scale  CBG (last 3)  Recent Labs    01/13/20 1639 01/13/20 2058 01/14/20 0913  GLUCAP 325* 302* 240*    Thrombocytopenia -Mild,  possibly due to EtOH  Scheduled Meds:  vitamin C  500 mg Oral Daily   chlorhexidine  15 mL Mouth Rinse BID   cholecalciferol  1,000 Units Oral Daily   enoxaparin (LOVENOX) injection  40 mg Subcutaneous Q24H   insulin aspart  0-9 Units Subcutaneous TID WC   mouth rinse  15 mL Mouth Rinse q12n4p   methylPREDNISolone (SOLU-MEDROL) injection  0.5 mg/kg Intravenous Q12H   Followed by   Melene Muller ON 01/16/2020] predniSONE  50 mg Oral Daily   zinc sulfate  220 mg Oral Daily   Continuous Infusions:  remdesivir 100 mg in NS 100 mL 100 mg (01/13/20 0936)   PRN Meds:.acetaminophen, albuterol, chlorpheniramine-HYDROcodone, guaiFENesin-dextromethorphan  DVT prophylaxis: Lovenox Code Status: Full code Family Communication: no family at bedside    Status is: Inpatient  Remains inpatient appropriate because:Inpatient level of care appropriate due to severity of illness  Dispo: The patient is from: Home              Anticipated d/c is to: Home              Anticipated d/c date is: 3 days              Patient currently is not medically stable to d/c.   Consultants:  None   Procedures:  None   Microbiology: None   Antibacterials: None    Objective: Vitals:   01/13/20 1002 01/13/20 1238 01/13/20 2056 01/14/20 0451  BP: 134/85 128/88 Marland Kitchen)  107/95 128/85  Pulse: 87 85 78 72  Resp: (!) 24 (!) 24 20 (!) 22  Temp: 99.2 F (37.3 C) 98.5 F (36.9 C) 98.4 F (36.9 C) 97.8 F (36.6 C)  TempSrc: Oral Oral Oral Oral  SpO2: 94% (!) 88% 92% (!) 89%  Weight:      Height:        Intake/Output Summary (Last 24 hours) at 01/14/2020 1028 Last data filed at 01/13/2020 1300 Gross per 24 hour  Intake --  Output 500 ml  Net -500 ml   Filed Weights   01/12/20 1728 01/12/20 2230  Weight: 72.6 kg 72.7 kg    Examination:  Constitutional: No distress Eyes: No icterus ENMT: mmm Neck: normal, supple Respiratory: Faint rhonchi at the bases without wheezing or crackles, moves air  well Cardiovascular: Regular rate and rhythm, no murmurs, no edema Abdomen: Soft, nontender, nondistended, bowel sounds positive Musculoskeletal: no clubbing / cyanosis.  Skin: No rashes seen Neurologic: Nonfocal  Data Reviewed: I have independently reviewed following labs and imaging studies   CBC: Recent Labs  Lab 01/12/20 1739 01/13/20 0420 01/14/20 0443  WBC 5.2 4.7 4.4  NEUTROABS 4.4 4.1 3.6  HGB 16.6 15.8 15.4  HCT 46.8 45.1 44.3  MCV 86.0 86.4 87.5  PLT 143* 141* 176   Basic Metabolic Panel: Recent Labs  Lab 01/12/20 1739 01/13/20 0420 01/14/20 0443  NA 133* 134* 133*  K 3.4* 4.3 4.0  CL 95* 100 100  CO2 25 24 23   GLUCOSE 198* 268* 295*  BUN 10 16 20   CREATININE 0.87 0.82 0.85  CALCIUM 8.6* 8.5* 8.6*   GFR: Estimated Creatinine Clearance: 89 mL/min (by C-G formula based on SCr of 0.85 mg/dL). Liver Function Tests: Recent Labs  Lab 01/12/20 1739 01/13/20 0420 01/14/20 0443  AST 43* 36 32  ALT 36 32 30  ALKPHOS 68 59 57  BILITOT 0.9 0.8 0.7  PROT 8.2* 7.3 7.0  ALBUMIN 3.9 3.3* 3.1*   No results for input(s): LIPASE, AMYLASE in the last 168 hours. No results for input(s): AMMONIA in the last 168 hours. Coagulation Profile: No results for input(s): INR, PROTIME in the last 168 hours. Cardiac Enzymes: No results for input(s): CKTOTAL, CKMB, CKMBINDEX, TROPONINI in the last 168 hours. BNP (last 3 results) No results for input(s): PROBNP in the last 8760 hours. HbA1C: Recent Labs    01/14/20 0443  HGBA1C 9.3*   CBG: Recent Labs  Lab 01/13/20 1125 01/13/20 1639 01/13/20 2058 01/14/20 0913  GLUCAP 379* 325* 302* 240*   Lipid Profile: Recent Labs    01/12/20 1739  TRIG 69   Thyroid Function Tests: No results for input(s): TSH, T4TOTAL, FREET4, T3FREE, THYROIDAB in the last 72 hours. Anemia Panel: Recent Labs    01/12/20 1739  FERRITIN 368*   Urine analysis: No results found for: COLORURINE, APPEARANCEUR, LABSPEC, PHURINE,  GLUCOSEU, HGBUR, BILIRUBINUR, KETONESUR, PROTEINUR, UROBILINOGEN, NITRITE, LEUKOCYTESUR Sepsis Labs: Invalid input(s): PROCALCITONIN, LACTICIDVEN  Recent Results (from the past 240 hour(s))  Blood Culture (routine x 2)     Status: None (Preliminary result)   Collection Time: 01/12/20  5:39 PM   Specimen: BLOOD  Result Value Ref Range Status   Specimen Description   Final    BLOOD LEFT ANTECUBITAL Performed at [facility], [address] [place], [place]    Special Requests   Final    BOTTLES DRAWN AEROBIC AND ANAEROBIC Blood Culture adequate volume Performed at [facility], [address] [place], [place] [address]    Culture   Final    NO GROWTH < 12 HOURS Performed at [facility], [address] [place], [place] [address]    Report Status PENDING  Incomplete  Resp Panel by RT-PCR (Flu A&B, Covid) Nasopharyngeal Swab     Status: Abnormal   Collection Time: 01/12/20  5:41 PM   Specimen: Nasopharyngeal Swab; Nasopharyngeal(NP) swabs in vial transport medium  Result Value Ref Range Status   SARS Coronavirus 2 by RT PCR POSITIVE (A) NEGATIVE Final    Comment: RESULT CALLED TO, READ BACK BY AND VERIFIED WITH: LYNCH J. 11.24.21 @ 2157 BY MECIAL J. (NOTE) SARS-CoV-2 target nucleic acids are DETECTED.  The SARS-CoV-2 RNA is generally detectable in upper respiratory specimens during the acute phase of infection. Positive results are indicative of the presence of the identified virus, but do not rule out bacterial infection or co-infection with other pathogens not detected by the test. Clinical correlation with patient history and other diagnostic information is necessary to determine patient infection status. The expected result is Negative.  Fact Sheet for Patients: BloggerCourse.com  Fact Sheet for Healthcare Providers: SeriousBroker.it  This test is not yet approved or  cleared by the [place] FDA and  has been authorized for detection and/or diagnosis of SARS-CoV-2 by FDA under an Emergency Use Authorization (EUA).  This EUA will remain in effect (meaning this test ca n be used) for the duration of  the COVID-19 declaration under Section 564(b)(1) of the Act, 21 U.S.C. section 360bbb-3(b)(1), unless the authorization is terminated or revoked sooner.     Influenza A by PCR NEGATIVE NEGATIVE Final   Influenza B by PCR NEGATIVE NEGATIVE Final    Comment: (NOTE) The Xpert Xpress SARS-CoV-2/FLU/RSV plus assay is intended as an aid in the diagnosis of influenza from Nasopharyngeal swab specimens and should not be used as a sole basis for treatment. Nasal washings and aspirates are unacceptable for Xpert Xpress SARS-CoV-2/FLU/RSV testing.  Fact Sheet for Patients: BloggerCourse.com  Fact Sheet for Healthcare Providers: SeriousBroker.it  This test is not yet approved or cleared by the [place] FDA and has been authorized for detection and/or diagnosis of SARS-CoV-2 by FDA under an Emergency Use Authorization (EUA). This EUA will remain in effect (meaning this test can be used) for the duration of the COVID-19 declaration under Section 564(b)(1) of the Act, 21 U.S.C. section 360bbb-3(b)(1), unless the authorization is terminated or revoked.  Performed at [facility], [address], [place], [place] [address]   Blood Culture (routine x 2)     Status: None (Preliminary result)   Collection Time: 01/12/20  5:44 PM   Specimen: BLOOD  Result Value Ref Range Status   Specimen Description   Final    BLOOD RIGHT ANTECUBITAL Performed at [facility], [address], [place], [place] [address]    Special Requests   Final    BOTTLES DRAWN AEROBIC AND ANAEROBIC Blood Culture adequate volume Performed at [facility], [address],  [place], [place] [address]    Culture   Final    NO GROWTH < 12 HOURS Performed at [facility], [address] [place], [place] [address]    Report Status PENDING  Incomplete      Radiology Studies: DG Chest 1 View  Result Date: 01/12/2020 CLINICAL DATA:  Shortness of breath, COVID positive EXAM: CHEST  1 VIEW COMPARISON:  No previous chest imaging  for comparison FINDINGS: Lung volumes are low. Cardiomediastinal contours accentuated by low lung volumes. Patchy opacities bilaterally at the lung bases worse on the LEFT than the RIGHT. No lobar consolidative changes. No sign of pleural effusion. On limited assessment no acute skeletal process. IMPRESSION: Low lung volumes with patchy opacities at the lung bases, worse on the LEFT than the RIGHT, likely reflecting multifocal pneumonia in this patient with history of COVID-19 infection. Concomitant pulmonary edema is possible given peribronchial cuffing and the increased interstitial markings. Electronically Signed   By: Donzetta KohutGeoffrey  Wile M.D.   On: 01/12/2020 18:03   ECHOCARDIOGRAM COMPLETE  Result Date: 01/13/2020    ECHOCARDIOGRAM REPORT   Patient Name:   Tally JoeJUAN Melka Date of Exam: 01/13/2020 Medical Rec #:  [id number]   Height:       61.0 in Accession #:    [id number]  Weight:       160.2 lb Date of Birth:  March 22, 1970   BSA:          1.719 m Patient Age:    49 years    BP:           134/85 mmHg Patient Gender: M           HR:           87 bpm. Exam Location:  Inpatient Procedure: 2D Echo, Color Doppler and Cardiac Doppler Indications:    Abnormal ECG 794.31 / R94.31  History:        Patient has no prior history of Echocardiogram examinations.                 Signs/Symptoms:Shortness of Breath. COVID 19.  Sonographer:    Leta Junglingiffany Cooper RDCS Referring Phys: [id number] VASUNDHRA RATHORE IMPRESSIONS  1. Left ventricular ejection fraction, by estimation, is 65 to 70%. The left ventricle has normal function. The left ventricle has no regional wall motion  abnormalities. There is mild left ventricular hypertrophy of the basal-septal segment. Left ventricular diastolic parameters are consistent with Grade I diastolic dysfunction (impaired relaxation).  2. Right ventricular systolic function is normal. The right ventricular size is normal.  3. The mitral valve is normal in structure. Mild mitral valve regurgitation. No evidence of mitral stenosis.  4. The aortic valve is normal in structure. Aortic valve regurgitation is not visualized. No aortic stenosis is present.  5. The inferior vena cava is normal in size with greater than 50% respiratory variability, suggesting right atrial pressure of 3 mmHg. FINDINGS  Left Ventricle: Left ventricular ejection fraction, by estimation, is 65 to 70%. The left ventricle has normal function. The left ventricle has no regional wall motion abnormalities. The left ventricular internal cavity size was normal in size. There is  mild left ventricular hypertrophy of the basal-septal segment. Left ventricular diastolic parameters are consistent with Grade I diastolic dysfunction (impaired relaxation). Right Ventricle: The right ventricular size is normal. No increase in right ventricular wall thickness. Right ventricular systolic function is normal. Left Atrium: Left atrial size was normal in size. Right Atrium: Right atrial size was normal in size. Pericardium: There is no evidence of pericardial effusion. Mitral Valve: The mitral valve is normal in structure. Mild mitral valve regurgitation. No evidence of mitral valve stenosis. Tricuspid Valve: The tricuspid valve is normal in structure. Tricuspid valve regurgitation is not demonstrated. No evidence of tricuspid stenosis. Aortic Valve: The aortic valve is normal in structure. Aortic valve regurgitation is not visualized. No aortic stenosis is present. Pulmonic Valve: The pulmonic valve was  normal in structure. Pulmonic valve regurgitation is not visualized. No evidence of pulmonic  stenosis. Aorta: The aortic root is normal in size and structure. Venous: The inferior vena cava is normal in size with greater than 50% respiratory variability, suggesting right atrial pressure of 3 mmHg. IAS/Shunts: No atrial level shunt detected by color flow Doppler.  LEFT VENTRICLE PLAX 2D LVIDd:         4.10 cm  Diastology LVIDs:         2.60 cm  LV e' medial:    5.33 cm/s LV PW:         0.70 cm  LV E/e' medial:  11.4 LV IVS:        1.43 cm  LV e' lateral:   5.55 cm/s LVOT diam:     1.90 cm  LV E/e' lateral: 11.0 LV SV:         58 LV SV Index:   34 LVOT Area:     2.84 cm  RIGHT VENTRICLE RV S prime:     13.60 cm/s TAPSE (M-mode): 1.7 cm LEFT ATRIUM           Index LA diam:      3.20 cm 1.86 cm/m LA Vol (A2C): 27.9 ml 16.23 ml/m LA Vol (A4C): 28.9 ml 16.81 ml/m  AORTIC VALVE LVOT Vmax:   93.40 cm/s LVOT Vmean:  76.400 cm/s LVOT VTI:    0.206 m  AORTA Ao Root diam: 3.20 cm MITRAL VALVE MV Area (PHT): 2.79 cm    SHUNTS MV Decel Time: 272 msec    Systemic VTI:  0.21 m MV E velocity: 60.80 cm/s  Systemic Diam: 1.90 cm MV A velocity: 74.60 cm/s MV E/A ratio:  0.82 Donato Schultz MD Electronically signed by Donato Schultz MD Signature Date/Time: 01/13/2020/1:41:30 PM    Final      Pamella Pert, MD, PhD [facility]  Between 7 am - 7 pm I am available, please contact me via Amion or Securechat  Between 7 pm - 7 am I am not available, please contact night coverage MD/APP via Amion

## 2020-01-15 LAB — CBC WITH DIFFERENTIAL/PLATELET
Abs Immature Granulocytes: 0.06 10*3/uL (ref 0.00–0.07)
Basophils Absolute: 0 10*3/uL (ref 0.0–0.1)
Basophils Relative: 0 %
Eosinophils Absolute: 0 10*3/uL (ref 0.0–0.5)
Eosinophils Relative: 0 %
HCT: 45.5 % (ref 39.0–52.0)
Hemoglobin: 15.7 g/dL (ref 13.0–17.0)
Immature Granulocytes: 1 %
Lymphocytes Relative: 10 %
Lymphs Abs: 0.6 10*3/uL — ABNORMAL LOW (ref 0.7–4.0)
MCH: 30.6 pg (ref 26.0–34.0)
MCHC: 34.5 g/dL (ref 30.0–36.0)
MCV: 88.7 fL (ref 80.0–100.0)
Monocytes Absolute: 0.4 10*3/uL (ref 0.1–1.0)
Monocytes Relative: 6 %
Neutro Abs: 4.8 10*3/uL (ref 1.7–7.7)
Neutrophils Relative %: 83 %
Platelets: 200 10*3/uL (ref 150–400)
RBC: 5.13 MIL/uL (ref 4.22–5.81)
RDW: 11.9 % (ref 11.5–15.5)
WBC: 5.8 10*3/uL (ref 4.0–10.5)
nRBC: 0 % (ref 0.0–0.2)

## 2020-01-15 LAB — COMPREHENSIVE METABOLIC PANEL
ALT: 31 U/L (ref 0–44)
AST: 28 U/L (ref 15–41)
Albumin: 3.1 g/dL — ABNORMAL LOW (ref 3.5–5.0)
Alkaline Phosphatase: 52 U/L (ref 38–126)
Anion gap: 11 (ref 5–15)
BUN: 25 mg/dL — ABNORMAL HIGH (ref 6–20)
CO2: 23 mmol/L (ref 22–32)
Calcium: 8.4 mg/dL — ABNORMAL LOW (ref 8.9–10.3)
Chloride: 100 mmol/L (ref 98–111)
Creatinine, Ser: 0.77 mg/dL (ref 0.61–1.24)
GFR, Estimated: >60 mL/min (ref >60)
Glucose, Bld: 300 mg/dL — ABNORMAL HIGH (ref 70–99)
Potassium: 4.3 mmol/L (ref 3.5–5.1)
Sodium: 134 mmol/L — ABNORMAL LOW (ref 135–145)
Total Bilirubin: 0.8 mg/dL (ref 0.3–1.2)
Total Protein: 7 g/dL (ref 6.5–8.1)

## 2020-01-15 LAB — GLUCOSE, CAPILLARY
Glucose-Capillary: 255 mg/dL — ABNORMAL HIGH (ref 70–99)
Glucose-Capillary: 337 mg/dL — ABNORMAL HIGH (ref 70–99)
Glucose-Capillary: 380 mg/dL — ABNORMAL HIGH (ref 70–99)
Glucose-Capillary: 367 mg/dL — ABNORMAL HIGH (ref 70–99)

## 2020-01-15 LAB — D-DIMER, QUANTITATIVE: D-Dimer, Quant: 0.5 ug/mL-FEU (ref 0.00–0.50)

## 2020-01-15 LAB — C-REACTIVE PROTEIN: CRP: 2.9 mg/dL — ABNORMAL HIGH (ref <1.0)

## 2020-01-15 NOTE — Progress Notes (Signed)
Pt's O2 Sat was 88% on 3L [place]. Increased pt to 4L O2 saturation increased to 92%. Pt's O2 saturation decreases to 86-89 with exertion and while eating. Pt is currently on 3L , O2 saturation steady at 93% Was able to get pt out of bed and gradually perform stretching exercises while standing. Pt gets lightheaded initially when standing but resolves quickly. Pt states he feels better when he is moving. Will walk pt around the room after dinner.  Val [place]

## 2020-01-15 NOTE — Progress Notes (Signed)
PROGRESS NOTE  Bruce Arnold ZOX:[id number] DOB: 1970/08/12 DOA: 01/12/2020 PCP: Patient, No Pcp Per   LOS: 3 days   Brief Narrative / Interim history: 49 year old male with no significant past medical history other than EtOH abuse/binge drinking, came into the hospital with shortness of breath.  He tested positive for Covid at [facility] on 11/16, he was having generalized weakness, fevers, body aches, and had progressive shortness of breath for which he came to the hospital.  Chest x-ray showed bibasilar patchy opacities, worse on the left than the right, he was found to be hypoxic requiring 2 L of oxygen and was admitted to the hospital.  Subjective / 24h Interval events: Spanish interpreter used  Today tells me he feels "much much better".  Has not walked yet.  He also tells me unfortunately that his son has been admitted to the hospital also  Assessment & Plan:  Principal Problem Acute Hypoxic Respiratory Failure due to Covid-19 Viral Illness sepsis due to viral illness -Continue remdesivir, steroids, patient still on 2 L.  Consented for Actemra/baricitinib if needed later on if he is oxygen requirements get worse -Incentive spirometry, flutter valve, early ambulation, proning as able -D-dimer, CRP, improving -We will walk more today   COVID-19 Labs  Recent Labs    01/12/20 1739 01/12/20 1739 01/13/20 0420 01/14/20 0443 01/15/20 0534  DDIMER 1.01*   < > 0.78* 0.65* 0.50  FERRITIN 368*  --   --   --   --   LDH 278*  --   --   --   --   CRP 14.0*   < > 14.0* 7.4* 2.9*   < > = values in this interval not displayed.    Lab Results  Component Value Date   SARSCOV2NAA POSITIVE (A) 01/12/2020    Active Problems Alcohol abuse -Patient is not a daily drinker and tells me he drinks maybe every other week but when he does usually goes through 12 pack of beer.  Occasionally he drinks tequila discharge with dinner.  Not triggering CIWA  Obesity -Based on BMI of 30.2, patient  will benefit from weight loss  Hyperglycemia -Continue sliding scale  CBG (last 3)  Recent Labs    01/14/20 1713 01/14/20 2116 01/15/20 0800  GLUCAP 337* 330* 255*    Thrombocytopenia -Mild, possibly due to EtOH  Scheduled Meds: . vitamin C  500 mg Oral Daily  . chlorhexidine  15 mL Mouth Rinse BID  . cholecalciferol  1,000 Units Oral Daily  . enoxaparin (LOVENOX) injection  40 mg Subcutaneous Q24H  . insulin aspart  0-9 Units Subcutaneous TID WC  . mouth rinse  15 mL Mouth Rinse q12n4p  . methylPREDNISolone (SOLU-MEDROL) injection  0.5 mg/kg Intravenous Q12H   Followed by  . [START ON 01/16/2020] predniSONE  50 mg Oral Daily  . zinc sulfate  220 mg Oral Daily   Continuous Infusions: . remdesivir 100 mg in NS 100 mL 100 mg (01/15/20 1118)   PRN Meds:.acetaminophen, albuterol, chlorpheniramine-HYDROcodone, guaiFENesin-dextromethorphan  DVT prophylaxis: Lovenox Code Status: Full code Family Communication: no family at bedside    Status is: Inpatient  Remains inpatient appropriate because:Inpatient level of care appropriate due to severity of illness  Dispo: The patient is from: Home              Anticipated d/c is to: Home              Anticipated d/c date is: 3 days  Patient currently is not medically stable to d/c.   Consultants:  None   Procedures:  None   Microbiology: None   Antibacterials: None    Objective: Vitals:   01/14/20 1452 01/14/20 1843 01/14/20 2000 01/15/20 0511  BP: 133/79  128/76 124/78  Pulse: 80 71 76 67  Resp: 20  20 20   Temp: (!) 100.6 F (38.1 C) 98 F (36.7 C) 98.2 F (36.8 C) 98.5 F (36.9 C)  TempSrc: Oral Oral Oral Oral  SpO2: (!) 89% (!) 87% 90% 93%  Weight:      Height:        Intake/Output Summary (Last 24 hours) at 01/15/2020 1137 Last data filed at 01/15/2020 1118 Gross per 24 hour  Intake --  Output 2875 ml  Net -2875 ml   Filed Weights   01/12/20 1728 01/12/20 2230  Weight: 72.6 kg 72.7  kg    Examination:  Constitutional: no distress Eyes: No scleral icterus ENMT: Moist mucous membranes Neck: normal, supple Respiratory: Clear bilaterally without wheezing or crackles Cardiovascular: Regular rate and rhythm, no murmurs Abdomen: Soft, nondistended, bowel sounds positive Musculoskeletal: no clubbing / cyanosis.  Skin: No rashes seen Neurologic: Nonfocal  Data Reviewed: I have independently reviewed following labs and imaging studies   CBC: Recent Labs  Lab 01/12/20 1739 01/13/20 0420 01/14/20 0443 01/15/20 0534  WBC 5.2 4.7 4.4 5.8  NEUTROABS 4.4 4.1 3.6 4.8  HGB 16.6 15.8 15.4 15.7  HCT 46.8 45.1 44.3 45.5  MCV 86.0 86.4 87.5 88.7  PLT 143* 141* 176 200   Basic Metabolic Panel: Recent Labs  Lab 01/12/20 1739 01/13/20 0420 01/14/20 0443 01/15/20 0534  NA 133* 134* 133* 134*  K 3.4* 4.3 4.0 4.3  CL 95* 100 100 100  CO2 25 24 23 23   GLUCOSE 198* 268* 295* 300*  BUN 10 16 20  25*  CREATININE 0.87 0.82 0.85 0.77  CALCIUM 8.6* 8.5* 8.6* 8.4*   GFR: Estimated Creatinine Clearance: 94.5 mL/min (by C-G formula based on SCr of 0.77 mg/dL). Liver Function Tests: Recent Labs  Lab 01/12/20 1739 01/13/20 0420 01/14/20 0443 01/15/20 0534  AST 43* 36 32 28  ALT 36 32 30 31  ALKPHOS 68 59 57 52  BILITOT 0.9 0.8 0.7 0.8  PROT 8.2* 7.3 7.0 7.0  ALBUMIN 3.9 3.3* 3.1* 3.1*   No results for input(s): LIPASE, AMYLASE in the last 168 hours. No results for input(s): AMMONIA in the last 168 hours. Coagulation Profile: No results for input(s): INR, PROTIME in the last 168 hours. Cardiac Enzymes: No results for input(s): CKTOTAL, CKMB, CKMBINDEX, TROPONINI in the last 168 hours. BNP (last 3 results) No results for input(s): PROBNP in the last 8760 hours. HbA1C: Recent Labs    01/14/20 0443  HGBA1C 9.3*   CBG: Recent Labs  Lab 01/14/20 0913 01/14/20 1211 01/14/20 1713 01/14/20 2116 01/15/20 0800  GLUCAP 240* 258* 337* 330* 255*   Lipid  Profile: Recent Labs    01/12/20 1739  TRIG 69   Thyroid Function Tests: No results for input(s): TSH, T4TOTAL, FREET4, T3FREE, THYROIDAB in the last 72 hours. Anemia Panel: Recent Labs    01/12/20 1739  FERRITIN 368*   Urine analysis: No results found for: COLORURINE, APPEARANCEUR, LABSPEC, PHURINE, GLUCOSEU, HGBUR, BILIRUBINUR, KETONESUR, PROTEINUR, UROBILINOGEN, NITRITE, LEUKOCYTESUR Sepsis Labs: Invalid input(s): PROCALCITONIN, LACTICIDVEN  Recent Results (from the past 240 hour(s))  Blood Culture (routine x 2)     Status: None (Preliminary result)   Collection Time: 01/12/20  5:39 PM   Specimen: BLOOD  Result Value Ref Range Status   Specimen Description   Final    BLOOD LEFT ANTECUBITAL Performed at [facility], [address] [place], [place] [address]    Special Requests   Final    BOTTLES DRAWN AEROBIC AND ANAEROBIC Blood Culture adequate volume Performed at [facility], [address], [place], [place] [address]    Culture   Final    NO GROWTH 3 DAYS Performed at [facility], [address] [place], [place] [address]    Report Status PENDING  Incomplete  Resp Panel by RT-PCR (Flu A&B, Covid) Nasopharyngeal Swab     Status: Abnormal   Collection Time: 01/12/20  5:41 PM   Specimen: Nasopharyngeal Swab; Nasopharyngeal(NP) swabs in vial transport medium  Result Value Ref Range Status   SARS Coronavirus 2 by RT PCR POSITIVE (A) NEGATIVE Final    Comment: RESULT CALLED TO, READ BACK BY AND VERIFIED WITH: LYNCH J. 11.24.21 @ 2157 BY MECIAL J. (NOTE) SARS-CoV-2 target nucleic acids are DETECTED.  The SARS-CoV-2 RNA is generally detectable in upper respiratory specimens during the acute phase of infection. Positive results are indicative of the presence of the identified virus, but do not rule out bacterial infection or co-infection with other pathogens not detected by the test. Clinical correlation with patient  history and other diagnostic information is necessary to determine patient infection status. The expected result is Negative.  Fact Sheet for Patients: BloggerCourse.com  Fact Sheet for Healthcare Providers: SeriousBroker.it  This test is not yet approved or cleared by the [place] FDA and  has been authorized for detection and/or diagnosis of SARS-CoV-2 by FDA under an Emergency Use Authorization (EUA).  This EUA will remain in effect (meaning this test ca n be used) for the duration of  the COVID-19 declaration under Section 564(b)(1) of the Act, 21 U.S.C. section 360bbb-3(b)(1), unless the authorization is terminated or revoked sooner.     Influenza A by PCR NEGATIVE NEGATIVE Final   Influenza B by PCR NEGATIVE NEGATIVE Final    Comment: (NOTE) The Xpert Xpress SARS-CoV-2/FLU/RSV plus assay is intended as an aid in the diagnosis of influenza from Nasopharyngeal swab specimens and should not be used as a sole basis for treatment. Nasal washings and aspirates are unacceptable for Xpert Xpress SARS-CoV-2/FLU/RSV testing.  Fact Sheet for Patients: BloggerCourse.com  Fact Sheet for Healthcare Providers: SeriousBroker.it  This test is not yet approved or cleared by the [place] FDA and has been authorized for detection and/or diagnosis of SARS-CoV-2 by FDA under an Emergency Use Authorization (EUA). This EUA will remain in effect (meaning this test can be used) for the duration of the COVID-19 declaration under Section 564(b)(1) of the Act, 21 U.S.C. section 360bbb-3(b)(1), unless the authorization is terminated or revoked.  Performed at [facility], [address] [place], [place] [address]   Blood Culture (routine x 2)     Status: None (Preliminary result)   Collection Time: 01/12/20  5:44 PM   Specimen: BLOOD  Result Value Ref Range Status    Specimen Description   Final    BLOOD RIGHT ANTECUBITAL Performed at [facility], [address], [place], [place] [address]    Special Requests   Final    BOTTLES DRAWN AEROBIC AND ANAEROBIC Blood Culture adequate volume Performed at [facility], [address], [place], [place] [address]    Culture  Final    NO GROWTH 3 DAYS Performed at [facility], [address], [place], [place] [address]    Report Status PENDING  Incomplete      Radiology Studies: ECHOCARDIOGRAM COMPLETE  Result Date: 01/13/2020    ECHOCARDIOGRAM REPORT   Patient Name:   Bruce Arnold Date of Exam: 01/13/2020 Medical Rec #:  [id number]   Height:       61.0 in Accession #:    [id number]  Weight:       160.2 lb Date of Birth:  02-07-1971   BSA:          1.719 m Patient Age:    49 years    BP:           134/85 mmHg Patient Gender: M           HR:           87 bpm. Exam Location:  Inpatient Procedure: 2D Echo, Color Doppler and Cardiac Doppler Indications:    Abnormal ECG 794.31 / R94.31  History:        Patient has no prior history of Echocardiogram examinations.                 Signs/Symptoms:Shortness of Breath. COVID 19.  Sonographer:    Leta Jungling RDCS Referring Phys: [id number] VASUNDHRA RATHORE IMPRESSIONS  1. Left ventricular ejection fraction, by estimation, is 65 to 70%. The left ventricle has normal function. The left ventricle has no regional wall motion abnormalities. There is mild left ventricular hypertrophy of the basal-septal segment. Left ventricular diastolic parameters are consistent with Grade I diastolic dysfunction (impaired relaxation).  2. Right ventricular systolic function is normal. The right ventricular size is normal.  3. The mitral valve is normal in structure. Mild mitral valve regurgitation. No evidence of mitral stenosis.  4. The aortic valve is normal in structure. Aortic valve regurgitation is not visualized. No aortic stenosis is present.  5.  The inferior vena cava is normal in size with greater than 50% respiratory variability, suggesting right atrial pressure of 3 mmHg. FINDINGS  Left Ventricle: Left ventricular ejection fraction, by estimation, is 65 to 70%. The left ventricle has normal function. The left ventricle has no regional wall motion abnormalities. The left ventricular internal cavity size was normal in size. There is  mild left ventricular hypertrophy of the basal-septal segment. Left ventricular diastolic parameters are consistent with Grade I diastolic dysfunction (impaired relaxation). Right Ventricle: The right ventricular size is normal. No increase in right ventricular wall thickness. Right ventricular systolic function is normal. Left Atrium: Left atrial size was normal in size. Right Atrium: Right atrial size was normal in size. Pericardium: There is no evidence of pericardial effusion. Mitral Valve: The mitral valve is normal in structure. Mild mitral valve regurgitation. No evidence of mitral valve stenosis. Tricuspid Valve: The tricuspid valve is normal in structure. Tricuspid valve regurgitation is not demonstrated. No evidence of tricuspid stenosis. Aortic Valve: The aortic valve is normal in structure. Aortic valve regurgitation is not visualized. No aortic stenosis is present. Pulmonic Valve: The pulmonic valve was normal in structure. Pulmonic valve regurgitation is not visualized. No evidence of pulmonic stenosis. Aorta: The aortic root is normal in size and structure. Venous: The inferior vena cava is normal in size with greater than 50% respiratory variability, suggesting right atrial pressure of 3 mmHg. IAS/Shunts: No atrial level shunt detected by color flow Doppler.  LEFT VENTRICLE PLAX 2D LVIDd:  4.10 cm  Diastology LVIDs:         2.60 cm  LV e' medial:    5.33 cm/s LV PW:         0.70 cm  LV E/e' medial:  11.4 LV IVS:        1.43 cm  LV e' lateral:   5.55 cm/s LVOT diam:     1.90 cm  LV E/e' lateral: 11.0 LV  SV:         58 LV SV Index:   34 LVOT Area:     2.84 cm  RIGHT VENTRICLE RV S prime:     13.60 cm/s TAPSE (M-mode): 1.7 cm LEFT ATRIUM           Index LA diam:      3.20 cm 1.86 cm/m LA Vol (A2C): 27.9 ml 16.23 ml/m LA Vol (A4C): 28.9 ml 16.81 ml/m  AORTIC VALVE LVOT Vmax:   93.40 cm/s LVOT Vmean:  76.400 cm/s LVOT VTI:    0.206 m  AORTA Ao Root diam: 3.20 cm MITRAL VALVE MV Area (PHT): 2.79 cm    SHUNTS MV Decel Time: 272 msec    Systemic VTI:  0.21 m MV E velocity: 60.80 cm/s  Systemic Diam: 1.90 cm MV A velocity: 74.60 cm/s MV E/A ratio:  0.82 Donato Schultz MD Electronically signed by Donato Schultz MD Signature Date/Time: 01/13/2020/1:41:30 PM    Final      Pamella Pert, MD, PhD [facility]  Between 7 am - 7 pm I am available, please contact me via Amion or Securechat  Between 7 pm - 7 am I am not available, please contact night coverage MD/APP via Amion

## 2020-01-16 LAB — COMPREHENSIVE METABOLIC PANEL
ALT: 35 U/L (ref 0–44)
AST: 32 U/L (ref 15–41)
Albumin: 3.2 g/dL — ABNORMAL LOW (ref 3.5–5.0)
Alkaline Phosphatase: 55 U/L (ref 38–126)
Anion gap: 11 (ref 5–15)
BUN: 26 mg/dL — ABNORMAL HIGH (ref 6–20)
CO2: 24 mmol/L (ref 22–32)
Calcium: 8.6 mg/dL — ABNORMAL LOW (ref 8.9–10.3)
Chloride: 99 mmol/L (ref 98–111)
Creatinine, Ser: 0.8 mg/dL (ref 0.61–1.24)
GFR, Estimated: >60 mL/min (ref >60)
Glucose, Bld: 280 mg/dL — ABNORMAL HIGH (ref 70–99)
Potassium: 4.2 mmol/L (ref 3.5–5.1)
Sodium: 134 mmol/L — ABNORMAL LOW (ref 135–145)
Total Bilirubin: 0.8 mg/dL (ref 0.3–1.2)
Total Protein: 7.2 g/dL (ref 6.5–8.1)

## 2020-01-16 LAB — GLUCOSE, CAPILLARY
Glucose-Capillary: 265 mg/dL — ABNORMAL HIGH (ref 70–99)
Glucose-Capillary: 421 mg/dL — ABNORMAL HIGH (ref 70–99)
Glucose-Capillary: 364 mg/dL — ABNORMAL HIGH (ref 70–99)
Glucose-Capillary: 377 mg/dL — ABNORMAL HIGH (ref 70–99)

## 2020-01-16 LAB — CBC WITH DIFFERENTIAL/PLATELET
Abs Immature Granulocytes: 0.1 10*3/uL — ABNORMAL HIGH (ref 0.00–0.07)
Basophils Absolute: 0 10*3/uL (ref 0.0–0.1)
Basophils Relative: 0 %
Eosinophils Absolute: 0 10*3/uL (ref 0.0–0.5)
Eosinophils Relative: 0 %
HCT: 47.5 % (ref 39.0–52.0)
Hemoglobin: 16.4 g/dL (ref 13.0–17.0)
Immature Granulocytes: 2 %
Lymphocytes Relative: 9 %
Lymphs Abs: 0.5 10*3/uL — ABNORMAL LOW (ref 0.7–4.0)
MCH: 30.1 pg (ref 26.0–34.0)
MCHC: 34.5 g/dL (ref 30.0–36.0)
MCV: 87.2 fL (ref 80.0–100.0)
Monocytes Absolute: 0.3 10*3/uL (ref 0.1–1.0)
Monocytes Relative: 6 %
Neutro Abs: 4.1 10*3/uL (ref 1.7–7.7)
Neutrophils Relative %: 83 %
Platelets: 202 10*3/uL (ref 150–400)
RBC: 5.45 MIL/uL (ref 4.22–5.81)
RDW: 11.6 % (ref 11.5–15.5)
WBC: 5 10*3/uL (ref 4.0–10.5)
nRBC: 0 % (ref 0.0–0.2)

## 2020-01-16 LAB — D-DIMER, QUANTITATIVE: D-Dimer, Quant: 0.41 ug/mL-FEU (ref 0.00–0.50)

## 2020-01-16 LAB — GLUCOSE, RANDOM: Glucose, Bld: 419 mg/dL — ABNORMAL HIGH (ref 70–99)

## 2020-01-16 LAB — C-REACTIVE PROTEIN: CRP: 1.8 mg/dL — ABNORMAL HIGH (ref <1.0)

## 2020-01-16 MED ORDER — DEXAMETHASONE 4 MG PO TABS
6.0000 mg | ORAL_TABLET | Freq: Every day | ORAL | Status: DC
  Administered 2020-01-16 – 2020-01-17 (×2): 6 mg via ORAL
  Filled 2020-01-16 (×2): qty 1

## 2020-01-16 MED ORDER — INSULIN ASPART 100 UNIT/ML ~~LOC~~ SOLN
4.0000 [IU] | Freq: Three times a day (TID) | SUBCUTANEOUS | Status: DC
  Administered 2020-01-16 – 2020-01-17 (×4): 4 [IU] via SUBCUTANEOUS

## 2020-01-16 MED ORDER — INSULIN NPH (HUMAN) (ISOPHANE) 100 UNIT/ML ~~LOC~~ SUSP
5.0000 [IU] | Freq: Two times a day (BID) | SUBCUTANEOUS | Status: DC
  Administered 2020-01-16: 5 [IU] via SUBCUTANEOUS
  Filled 2020-01-16: qty 10

## 2020-01-16 MED ORDER — METFORMIN HCL 500 MG PO TABS
500.0000 mg | ORAL_TABLET | Freq: Two times a day (BID) | ORAL | Status: DC
  Administered 2020-01-16 – 2020-01-17 (×3): 500 mg via ORAL
  Filled 2020-01-16 (×3): qty 1

## 2020-01-16 NOTE — Progress Notes (Signed)
PROGRESS NOTE  Bruce Arnold ZYY:[id number] DOB: [birth year] DOA: 01/12/2020 PCP: Patient, No Pcp Per   LOS: 4 days   Brief Narrative / Interim history: 49 year old male with no significant past medical history other than EtOH abuse/binge drinking, came into the hospital with shortness of breath.  He tested positive for Covid at [facility] on 11/16, he was having generalized weakness, fevers, body aches, and had progressive shortness of breath for which he came to the hospital.  Chest x-ray showed bibasilar patchy opacities, worse on the left than the right, he was found to be hypoxic requiring 2 L of oxygen and was admitted to the hospital.  Subjective / 24h Interval events: Spanish interpreter used  Doing well today, did feel tired when he walked last night.  Complains of intermittent cough  Assessment & Plan:  Principal Problem Acute Hypoxic Respiratory Failure due to Covid-19 Viral Illness sepsis due to viral illness -Continue remdesivir, steroids, patient still on 2 L this morning.   -Incentive spirometry, flutter valve, early ambulation, proning as able -D-dimer, CRP, improving -Continue ambulation, wean off oxygen.   -On remdesivir, steroids  COVID-19 Labs  Recent Labs    01/14/20 0443 01/15/20 0534 01/16/20 0559  DDIMER 0.65* 0.50 0.41  CRP 7.4* 2.9* 1.8*    Lab Results  Component Value Date   SARSCOV2NAA POSITIVE (A) 01/12/2020    Active Problems Alcohol abuse -Patient is not a daily drinker and tells me he drinks maybe every other week but when he does usually goes through 12 pack of beer.  Occasionally he drinks tequila discharge with dinner.  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  This is new diagnosis.  Appears to have very poor medical insight.  Start Metformin, change steroids to Decadron and monitor CBGs  CBG  (last 3)  Recent Labs    01/15/20 1635 01/15/20 2057 01/16/20 0750  GLUCAP 380* 367* 265*    Thrombocytopenia -Mild, possibly due to EtOH  Scheduled Meds:  vitamin C  500 mg Oral Daily   chlorhexidine  15 mL Mouth Rinse BID   cholecalciferol  1,000 Units Oral Daily   dexamethasone  6 mg Oral Daily   enoxaparin (LOVENOX) injection  40 mg Subcutaneous Q24H   insulin aspart  0-9 Units Subcutaneous TID WC   mouth rinse  15 mL Mouth Rinse q12n4p   metFORMIN  500 mg Oral BID WC   zinc sulfate  220 mg Oral Daily   Continuous Infusions:  PRN Meds:.acetaminophen, albuterol, chlorpheniramine-HYDROcodone, guaiFENesin-dextromethorphan  DVT prophylaxis: Lovenox Code Status: Full code Family Communication: no family at bedside    Status is: Inpatient  Remains inpatient appropriate because:Inpatient level of care appropriate due to severity of illness  Dispo: The patient is from: Home              Anticipated d/c is to: Home              Anticipated d/c date is: 1 day              Patient currently is not medically stable to d/c.   Consultants:  None   Procedures:  None   Microbiology: None   Antibacterials: None    Objective: Vitals:   01/15/20 0511 01/15/20 1416 01/15/20 2055 01/16/20 0546  BP: 124/78 127/80 121/80 127/63  Pulse: 67 73 78   Resp: 20 20 20  20  Temp: 98.5 F (36.9 C) 97.9 F (36.6 C) 97.8 F (36.6 C) 98.2 F (36.8 C)  TempSrc: Oral Oral Oral Oral  SpO2: 93% (!) 88% 92% 92%  Weight:      Height:        Intake/Output Summary (Last 24 hours) at 01/16/2020 1144 Last data filed at 01/16/2020 1107 Gross per 24 hour  Intake 340 ml  Output 2800 ml  Net -2460 ml   Filed Weights   01/12/20 1728 01/12/20 2230  Weight: 72.6 kg 72.7 kg    Examination:  Constitutional: NAD, in bed Eyes: No icterus ENMT:  mmm Neck: normal, supple Respiratory: Diminished at bases but overall clear without wheezing Cardiovascular: Regular rate and  rhythm, no murmurs, no edema Abdomen: Soft, NT, ND, bowel sounds positive Musculoskeletal: no clubbing / cyanosis.  Skin: No rashes seen Neurologic: No focal deficits  Data Reviewed: I have independently reviewed following labs and imaging studies   CBC: Recent Labs  Lab 01/12/20 1739 01/13/20 0420 01/14/20 0443 01/15/20 0534 01/16/20 0559  WBC 5.2 4.7 4.4 5.8 5.0  NEUTROABS 4.4 4.1 3.6 4.8 4.1  HGB 16.6 15.8 15.4 15.7 16.4  HCT 46.8 45.1 44.3 45.5 47.5  MCV 86.0 86.4 87.5 88.7 87.2  PLT 143* 141* 176 200 202   Basic Metabolic Panel: Recent Labs  Lab 01/12/20 1739 01/13/20 0420 01/14/20 0443 01/15/20 0534 01/16/20 0559  NA 133* 134* 133* 134* 134*  K 3.4* 4.3 4.0 4.3 4.2  CL 95* 100 100 100 99  CO2 25 24 23 23 24   GLUCOSE 198* 268* 295* 300* 280*  BUN 10 16 20  25* 26*  CREATININE 0.87 0.82 0.85 0.77 0.80  CALCIUM 8.6* 8.5* 8.6* 8.4* 8.6*   GFR: Estimated Creatinine Clearance: 94.5 mL/min (by C-G formula based on SCr of 0.8 mg/dL). Liver Function Tests: Recent Labs  Lab 01/12/20 1739 01/13/20 0420 01/14/20 0443 01/15/20 0534 01/16/20 0559  AST 43* 36 32 28 32  ALT 36 32 30 31 35  ALKPHOS 68 59 57 52 55  BILITOT 0.9 0.8 0.7 0.8 0.8  PROT 8.2* 7.3 7.0 7.0 7.2  ALBUMIN 3.9 3.3* 3.1* 3.1* 3.2*   No results for input(s): LIPASE, AMYLASE in the last 168 hours. No results for input(s): AMMONIA in the last 168 hours. Coagulation Profile: No results for input(s): INR, PROTIME in the last 168 hours. Cardiac Enzymes: No results for input(s): CKTOTAL, CKMB, CKMBINDEX, TROPONINI in the last 168 hours. BNP (last 3 results) No results for input(s): PROBNP in the last 8760 hours. HbA1C: Recent Labs    01/14/20 0443  HGBA1C 9.3*   CBG: Recent Labs  Lab 01/15/20 0800 01/15/20 1145 01/15/20 1635 01/15/20 2057 01/16/20 0750  GLUCAP 255* 337* 380* 367* 265*   Lipid Profile: No results for input(s): CHOL, HDL, LDLCALC, TRIG, CHOLHDL, LDLDIRECT in the last 72  hours. Thyroid Function Tests: No results for input(s): TSH, T4TOTAL, FREET4, T3FREE, THYROIDAB in the last 72 hours. Anemia Panel: No results for input(s): VITAMINB12, FOLATE, FERRITIN, TIBC, IRON, RETICCTPCT in the last 72 hours. Urine analysis: No results found for: COLORURINE, APPEARANCEUR, LABSPEC, PHURINE, GLUCOSEU, HGBUR, BILIRUBINUR, KETONESUR, PROTEINUR, UROBILINOGEN, NITRITE, LEUKOCYTESUR Sepsis Labs: Invalid input(s): PROCALCITONIN, LACTICIDVEN  Recent Results (from the past 240 hour(s))  Blood Culture (routine x 2)     Status: None (Preliminary result)   Collection Time: 01/12/20  5:39 PM   Specimen: BLOOD  Result Value Ref Range Status   Specimen Description   Final    BLOOD  LEFT ANTECUBITAL Performed at [facility], [address] [place], [place] [address]    Special Requests   Final    BOTTLES DRAWN AEROBIC AND ANAEROBIC Blood Culture adequate volume Performed at [facility], [address] [place], [place] [address]    Culture   Final    NO GROWTH 4 DAYS Performed at [facility], [address] [place], [place] [address]    Report Status PENDING  Incomplete  Resp Panel by RT-PCR (Flu A&B, Covid) Nasopharyngeal Swab     Status: Abnormal   Collection Time: 01/12/20  5:41 PM   Specimen: Nasopharyngeal Swab; Nasopharyngeal(NP) swabs in vial transport medium  Result Value Ref Range Status   SARS Coronavirus 2 by RT PCR POSITIVE (A) NEGATIVE Final    Comment: RESULT CALLED TO, READ BACK BY AND VERIFIED WITH: LYNCH J. 11.24.21 @ 2157 BY MECIAL J. (NOTE) SARS-CoV-2 target nucleic acids are DETECTED.  The SARS-CoV-2 RNA is generally detectable in upper respiratory specimens during the acute phase of infection. Positive results are indicative of the presence of the identified virus, but do not rule out bacterial infection or co-infection with other pathogens not detected by the test. Clinical correlation with patient  history and other diagnostic information is necessary to determine patient infection status. The expected result is Negative.  Fact Sheet for Patients: BloggerCourse.comhttps://www.fda.gov/media/152166/download  Fact Sheet for Healthcare Providers: SeriousBroker.ithttps://www.fda.gov/media/152162/download  This test is not yet approved or cleared by the [place] FDA and  has been authorized for detection and/or diagnosis of SARS-CoV-2 by FDA under an Emergency Use Authorization (EUA).  This EUA will remain in effect (meaning this test ca n be used) for the duration of  the COVID-19 declaration under Section 564(b)(1) of the Act, 21 U.S.C. section 360bbb-3(b)(1), unless the authorization is terminated or revoked sooner.     Influenza A by PCR NEGATIVE NEGATIVE Final   Influenza B by PCR NEGATIVE NEGATIVE Final    Comment: (NOTE) The Xpert Xpress SARS-CoV-2/FLU/RSV plus assay is intended as an aid in the diagnosis of influenza from Nasopharyngeal swab specimens and should not be used as a sole basis for treatment. Nasal washings and aspirates are unacceptable for Xpert Xpress SARS-CoV-2/FLU/RSV testing.  Fact Sheet for Patients: BloggerCourse.comhttps://www.fda.gov/media/152166/download  Fact Sheet for Healthcare Providers: SeriousBroker.ithttps://www.fda.gov/media/152162/download  This test is not yet approved or cleared by the [place] FDA and has been authorized for detection and/or diagnosis of SARS-CoV-2 by FDA under an Emergency Use Authorization (EUA). This EUA will remain in effect (meaning this test can be used) for the duration of the COVID-19 declaration under Section 564(b)(1) of the Act, 21 U.S.C. section 360bbb-3(b)(1), unless the authorization is terminated or revoked.  Performed at [facility], [address] [place], [place] [address]   Blood Culture (routine x 2)     Status: None (Preliminary result)   Collection Time: 01/12/20  5:44 PM   Specimen: BLOOD  Result Value Ref Range Status    Specimen Description   Final    BLOOD RIGHT ANTECUBITAL Performed at [facility], [address] [place], [place] [address]    Special Requests   Final    BOTTLES DRAWN AEROBIC AND ANAEROBIC Blood Culture adequate volume Performed at [facility], [address] [place], [place] [address]    Culture   Final    NO GROWTH 4 DAYS Performed at [facility], [address] [place], [place] [address]  Report Status PENDING  Incomplete      Radiology Studies: No results found.   Pamella Pert, MD, PhD [facility]  Between 7 am - 7 pm I am available, please contact me via Amion or Securechat  Between 7 pm - 7 am I am not available, please contact night coverage MD/APP via Amion

## 2020-01-17 DIAGNOSIS — E119 Type 2 diabetes mellitus without complications: Secondary | ICD-10-CM

## 2020-01-17 LAB — COMPREHENSIVE METABOLIC PANEL
ALT: 34 U/L (ref 0–44)
AST: 24 U/L (ref 15–41)
Albumin: 3.3 g/dL — ABNORMAL LOW (ref 3.5–5.0)
Alkaline Phosphatase: 60 U/L (ref 38–126)
Anion gap: 12 (ref 5–15)
BUN: 23 mg/dL — ABNORMAL HIGH (ref 6–20)
CO2: 23 mmol/L (ref 22–32)
Calcium: 8.7 mg/dL — ABNORMAL LOW (ref 8.9–10.3)
Chloride: 98 mmol/L (ref 98–111)
Creatinine, Ser: 0.75 mg/dL (ref 0.61–1.24)
GFR, Estimated: >60 mL/min (ref >60)
Glucose, Bld: 266 mg/dL — ABNORMAL HIGH (ref 70–99)
Potassium: 4.5 mmol/L (ref 3.5–5.1)
Sodium: 133 mmol/L — ABNORMAL LOW (ref 135–145)
Total Bilirubin: 0.6 mg/dL (ref 0.3–1.2)
Total Protein: 7.4 g/dL (ref 6.5–8.1)

## 2020-01-17 LAB — CBC WITH DIFFERENTIAL/PLATELET
Abs Immature Granulocytes: 0.23 10*3/uL — ABNORMAL HIGH (ref 0.00–0.07)
Basophils Absolute: 0 10*3/uL (ref 0.0–0.1)
Basophils Relative: 1 %
Eosinophils Absolute: 0 10*3/uL (ref 0.0–0.5)
Eosinophils Relative: 0 %
HCT: 49.8 % (ref 39.0–52.0)
Hemoglobin: 17.5 g/dL — ABNORMAL HIGH (ref 13.0–17.0)
Immature Granulocytes: 3 %
Lymphocytes Relative: 14 %
Lymphs Abs: 1 10*3/uL (ref 0.7–4.0)
MCH: 29.9 pg (ref 26.0–34.0)
MCHC: 35.1 g/dL (ref 30.0–36.0)
MCV: 85 fL (ref 80.0–100.0)
Monocytes Absolute: 0.6 10*3/uL (ref 0.1–1.0)
Monocytes Relative: 8 %
Neutro Abs: 5.7 10*3/uL (ref 1.7–7.7)
Neutrophils Relative %: 74 %
Platelets: 276 10*3/uL (ref 150–400)
RBC: 5.86 MIL/uL — ABNORMAL HIGH (ref 4.22–5.81)
RDW: 11.6 % (ref 11.5–15.5)
WBC: 7.6 10*3/uL (ref 4.0–10.5)
nRBC: 0.5 % — ABNORMAL HIGH (ref 0.0–0.2)

## 2020-01-17 LAB — GLUCOSE, CAPILLARY
Glucose-Capillary: 235 mg/dL — ABNORMAL HIGH (ref 70–99)
Glucose-Capillary: 327 mg/dL — ABNORMAL HIGH (ref 70–99)
Glucose-Capillary: 365 mg/dL — ABNORMAL HIGH (ref 70–99)

## 2020-01-17 LAB — D-DIMER, QUANTITATIVE: D-Dimer, Quant: 0.37 ug/mL-FEU (ref 0.00–0.50)

## 2020-01-17 LAB — CULTURE, BLOOD (ROUTINE X 2)
Culture: NO GROWTH
Culture: NO GROWTH
Special Requests: ADEQUATE
Special Requests: ADEQUATE

## 2020-01-17 LAB — C-REACTIVE PROTEIN: CRP: 2.1 mg/dL — ABNORMAL HIGH (ref <1.0)

## 2020-01-17 MED ORDER — INSULIN NPH (HUMAN) (ISOPHANE) 100 UNIT/ML ~~LOC~~ SUSP: 8.0000 [IU] | Freq: Two times a day (BID) | SUBCUTANEOUS | 11 refills | Status: DC

## 2020-01-17 MED ORDER — INSULIN ISOPHANE & REGULAR (HUMAN 70-30)100 UNIT/ML KWIKPEN: 9.0000 [IU] | PEN_INJECTOR | Freq: Two times a day (BID) | SUBCUTANEOUS | 1 refills | Status: DC

## 2020-01-17 MED ORDER — LIVING WELL WITH DIABETES BOOK - IN SPANISH
Freq: Once | Status: AC
  Filled 2020-01-17: qty 1

## 2020-01-17 MED ORDER — ALBUTEROL SULFATE HFA 108 (90 BASE) MCG/ACT IN AERS: 2.0000 | INHALATION_SPRAY | RESPIRATORY_TRACT | 0 refills | Status: AC | PRN

## 2020-01-17 MED ORDER — GUAIFENESIN-DM 100-10 MG/5ML PO SYRP: 10.0000 mL | ORAL_SOLUTION | ORAL | 0 refills | Status: AC | PRN

## 2020-01-17 MED ORDER — NOVOLIN 70/30 FLEXPEN RELION (70-30) 100 UNIT/ML ~~LOC~~ SUPN: 9.0000 [IU] | PEN_INJECTOR | Freq: Two times a day (BID) | SUBCUTANEOUS | 11 refills | Status: AC

## 2020-01-17 MED ORDER — "INSULIN SYRINGE 31G X 5/16"" 0.3 ML MISC": 1.0000 | Freq: Two times a day (BID) | 0 refills | Status: DC

## 2020-01-17 MED ORDER — "BD HYPODERMIC NEEDLE 25G X 5/8"" MISC": 1.0000 | Freq: Two times a day (BID) | 5 refills | Status: AC

## 2020-01-17 MED ORDER — DEXAMETHASONE 6 MG PO TABS: 6.0000 mg | ORAL_TABLET | Freq: Every day | ORAL | 0 refills | Status: AC

## 2020-01-17 MED ORDER — METFORMIN HCL 500 MG PO TABS
500.0000 mg | ORAL_TABLET | Freq: Two times a day (BID) | ORAL | 0 refills | Status: DC
Start: 2020-01-17 — End: 2020-01-26

## 2020-01-17 MED ORDER — BLOOD GLUCOSE MONITOR KIT: PACK | 0 refills | Status: AC

## 2020-01-17 MED ORDER — INSULIN NPH (HUMAN) (ISOPHANE) 100 UNIT/ML ~~LOC~~ SUSP
8.0000 [IU] | Freq: Two times a day (BID) | SUBCUTANEOUS | Status: DC
  Administered 2020-01-17: 8 [IU] via SUBCUTANEOUS
  Filled 2020-01-17: qty 10

## 2020-01-17 NOTE — Discharge Summary (Signed)
Physician Discharge Summary  Bruce Arnold MQK:[id number] DOB: 01-28-1971 DOA: 01/12/2020  PCP: Patient, No Pcp Per  Admit date: 01/12/2020 Discharge date: 01/17/2020  Admitted From: home Disposition:  home  Recommendations for Outpatient Follow-up:  1. Follow up with PCP in 1-2 weeks  Home Health: none Equipment/Devices: none  Discharge Condition: stable CODE STATUS: Full code Diet recommendation: diabetic   HPI: Per admitting MD, Bruce Arnold is a 49 y.o. male with a past medical history of alcohol use/binge drinking, not vaccinated against Covid presenting with complaints of shortness of breath.  Spanish interpreter services used.  Patient states he tested positive for Covid at [facility] on 01/04/2020.  His symptoms started the same day and included fevers, body aches, shortness of breath, and abdominal pain.  Denies cough or chest pain.  States most of his symptoms resolved except shortness of breath and abdominal pain have persisted.  Patient states his abdominal pain has now resolved since he has been in the ED.  No nausea, vomiting, or diarrhea.  Patient reports drinking a 12 pack of beer once a month.  States he drinks the entire 12 pack all at once.  States he last consumed alcohol over 2 weeks ago.  Hospital Course / Discharge diagnoses: Principal Problem Acute Hypoxic Respiratory Failure due to Covid-19 Viral Illness sepsis due to viral illness -patient had mild pneumonia requiring 2 L of oxygen on admission.  He completed Remdesivir while hospitalized, he was also treated with steroids.  He was able to be weaned off to room air, clinically improved and will be discharged home in stable condition with 4 additional days of Decadron.  Active Problems Alcohol abuse -Patient is not a daily drinker and tells me he drinks maybe every other week but when he does usually goes through 12 pack of beer.  Occasionally he drinks tequila discharge with dinner.  Did not trigger CIWA in the  hospital.  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  This is new diagnosis.  Diabetes coordinator consulted.  Placed on 70/30 twice daily for ease of use and lack of insurance Thrombocytopenia -Mild, possibly due to EtOH   Discharge Instructions  Allergies as of 01/17/2020   No Known Allergies     Medication List    STOP taking these medications   aspirin 81 MG EC tablet   azithromycin 250 MG tablet Commonly known as: ZITHROMAX   ciprofloxacin 500 MG tablet Commonly known as: Cipro   ibuprofen 200 MG tablet Commonly known as: ADVIL   metroNIDAZOLE 500 MG tablet Commonly known as: Flagyl   oxyCODONE-acetaminophen 5-325 MG tablet Commonly known as: PERCOCET/ROXICET   Robitussin 12 Hour Cough 30 MG/5ML liquid Generic drug: dextromethorphan     TAKE these medications   albuterol 108 (90 Base) MCG/ACT inhaler Commonly known as: VENTOLIN HFA Inhale 2 puffs into the lungs every 4 (four) hours as needed for wheezing or shortness of breath.   blood glucose meter kit and supplies Kit Dispense based on patient and insurance preference. Use up to four times daily as directed. (FOR ICD-9 250.00, 250.01).   dexamethasone 6 MG tablet Commonly known as: DECADRON Take 1 tablet (6 mg total) by mouth daily for 4 days. Start taking on: January 18, 2020   guaiFENesin-dextromethorphan 100-10 MG/5ML syrup Commonly known as: ROBITUSSIN DM Take 10 mLs by mouth every 4 (four) hours as needed for cough.   insulin isophane &  regular human (70-30) 100 UNIT/ML KwikPen Commonly known as: HUMULIN 70/30 MIX Inject 9 Units into the skin 2 (two) times daily at 10 am and 4 pm.   INSULIN SYRINGE .3CC/31GX5/16" 31G X 5/16" 0.3 ML Misc 1 each by Does not apply route in the morning and at bedtime.   metFORMIN 500 MG tablet Commonly known as: GLUCOPHAGE Take 1 tablet (500 mg  total) by mouth 2 (two) times daily with a meal.       Consultations:  None   Procedures/Studies:  DG Chest 1 View  Result Date: 01/12/2020 CLINICAL DATA:  Shortness of breath, COVID positive EXAM: CHEST  1 VIEW COMPARISON:  No previous chest imaging for comparison FINDINGS: Lung volumes are low. Cardiomediastinal contours accentuated by low lung volumes. Patchy opacities bilaterally at the lung bases worse on the LEFT than the RIGHT. No lobar consolidative changes. No sign of pleural effusion. On limited assessment no acute skeletal process. IMPRESSION: Low lung volumes with patchy opacities at the lung bases, worse on the LEFT than the RIGHT, likely reflecting multifocal pneumonia in this patient with history of COVID-19 infection. Concomitant pulmonary edema is possible given peribronchial cuffing and the increased interstitial markings. Electronically Signed   By: Zetta Bills M.D.   On: 01/12/2020 18:03   ECHOCARDIOGRAM COMPLETE  Result Date: 01/13/2020    ECHOCARDIOGRAM REPORT   Patient Name:   Bruce Arnold Date of Exam: 01/13/2020 Medical Rec #:  [id number]   Height:       61.0 in Accession #:    [id number]  Weight:       160.2 lb Date of Birth:  1970/11/21   BSA:          1.719 m Patient Age:    49 years    BP:           134/85 mmHg Patient Gender: M           HR:           87 bpm. Exam Location:  Inpatient Procedure: 2D Echo, Color Doppler and Cardiac Doppler Indications:    Abnormal ECG 794.31 / R94.31  History:        Patient has no prior history of Echocardiogram examinations.                 Signs/Symptoms:Shortness of Breath. COVID 19.  Sonographer:    Darlina Sicilian RDCS Referring Phys: [id number] [place]  1. Left ventricular ejection fraction, by estimation, is 65 to 70%. The left ventricle has normal function. The left ventricle has no regional wall motion abnormalities. There is mild left ventricular hypertrophy of the basal-septal segment. Left ventricular  diastolic parameters are consistent with Grade I diastolic dysfunction (impaired relaxation).  2. Right ventricular systolic function is normal. The right ventricular size is normal.  3. The mitral valve is normal in structure. Mild mitral valve regurgitation. No evidence of mitral stenosis.  4. The aortic valve is normal in structure. Aortic valve regurgitation is not visualized. No aortic stenosis is present.  5. The inferior vena cava is normal in size with greater than 50% respiratory variability, suggesting right atrial pressure of 3 mmHg. FINDINGS  Left Ventricle: Left ventricular ejection fraction, by estimation, is 65 to 70%. The left ventricle has normal function. The left ventricle has no regional wall motion abnormalities. The left ventricular internal cavity size was normal in size. There is  mild left ventricular hypertrophy of the basal-septal segment. Left ventricular diastolic parameters are consistent with  Grade I diastolic dysfunction (impaired relaxation). Right Ventricle: The right ventricular size is normal. No increase in right ventricular wall thickness. Right ventricular systolic function is normal. Left Atrium: Left atrial size was normal in size. Right Atrium: Right atrial size was normal in size. Pericardium: There is no evidence of pericardial effusion. Mitral Valve: The mitral valve is normal in structure. Mild mitral valve regurgitation. No evidence of mitral valve stenosis. Tricuspid Valve: The tricuspid valve is normal in structure. Tricuspid valve regurgitation is not demonstrated. No evidence of tricuspid stenosis. Aortic Valve: The aortic valve is normal in structure. Aortic valve regurgitation is not visualized. No aortic stenosis is present. Pulmonic Valve: The pulmonic valve was normal in structure. Pulmonic valve regurgitation is not visualized. No evidence of pulmonic stenosis. Aorta: The aortic root is normal in size and structure. Venous: The inferior vena cava is normal in  size with greater than 50% respiratory variability, suggesting right atrial pressure of 3 mmHg. IAS/Shunts: No atrial level shunt detected by color flow Doppler.  LEFT VENTRICLE PLAX 2D LVIDd:         4.10 cm  Diastology LVIDs:         2.60 cm  LV e' medial:    5.33 cm/s LV PW:         0.70 cm  LV E/e' medial:  11.4 LV IVS:        1.43 cm  LV e' lateral:   5.55 cm/s LVOT diam:     1.90 cm  LV E/e' lateral: 11.0 LV SV:         58 LV SV Index:   34 LVOT Area:     2.84 cm  RIGHT VENTRICLE RV S prime:     13.60 cm/s TAPSE (M-mode): 1.7 cm LEFT ATRIUM           Index LA diam:      3.20 cm 1.86 cm/m LA Vol (A2C): 27.9 ml 16.23 ml/m LA Vol (A4C): 28.9 ml 16.81 ml/m  AORTIC VALVE LVOT Vmax:   93.40 cm/s LVOT Vmean:  76.400 cm/s LVOT VTI:    0.206 m  AORTA Ao Root diam: 3.20 cm MITRAL VALVE MV Area (PHT): 2.79 cm    SHUNTS MV Decel Time: 272 msec    Systemic VTI:  0.21 m MV E velocity: 60.80 cm/s  Systemic Diam: 1.90 cm MV A velocity: 74.60 cm/s MV E/A ratio:  0.82 Candee Furbish MD Electronically signed by Candee Furbish MD Signature Date/Time: 01/13/2020/1:41:30 PM    Final      Subjective: - no chest pain, shortness of breath, no abdominal pain, nausea or vomiting.   Discharge Exam: BP 119/80 (BP Location: Left Arm)    Pulse 68    Temp 97.9 F (36.6 C) (Oral)    Resp 18    Ht '5\' 1"'  (1.549 m)    Wt 72.7 kg Comment: bedscale   SpO2 94%    BMI 30.27 kg/m   General: Pt is alert, awake, not in acute distress Cardiovascular: RRR, S1/S2 +, no rubs, no gallops Respiratory: CTA bilaterally, no wheezing, no rhonchi Abdominal: Soft, NT, ND, bowel sounds + Extremities: no edema, no cyanosis    The results of significant diagnostics from this hospitalization (including imaging, microbiology, ancillary and laboratory) are listed below for reference.     Microbiology: Recent Results (from the past 240 hour(s))  Blood Culture (routine x 2)     Status: None (Preliminary result)   Collection Time: 01/12/20  5:39 PM  Specimen: BLOOD  Result Value Ref Range Status   Specimen Description   Final    BLOOD LEFT ANTECUBITAL Performed at [facility] [address] [place], [place] [address]    Special Requests   Final    BOTTLES DRAWN AEROBIC AND ANAEROBIC Blood Culture adequate volume Performed at [facility] [address], [place], [place] [address]    Culture   Final    NO GROWTH 4 DAYS Performed at [facility], [place] [address], [place], [place] [address]    Report Status PENDING  Incomplete  Resp Panel by RT-PCR (Flu A&B, Covid) Nasopharyngeal Swab     Status: Abnormal   Collection Time: 01/12/20  5:41 PM   Specimen: Nasopharyngeal Swab; Nasopharyngeal(NP) swabs in vial transport medium  Result Value Ref Range Status   SARS Coronavirus 2 by RT PCR POSITIVE (A) NEGATIVE Final    Comment: RESULT CALLED TO, READ BACK BY AND VERIFIED WITH: LYNCH J. 11.24.21 @ 2157 BY MECIAL J. (NOTE) SARS-CoV-2 target nucleic acids are DETECTED.  The SARS-CoV-2 RNA is generally detectable in upper respiratory specimens during the acute phase of infection. Positive results are indicative of the presence of the identified virus, but do not rule out bacterial infection or co-infection with other pathogens not detected by the test. Clinical correlation with patient history and other diagnostic information is necessary to determine patient infection status. The expected result is Negative.  Fact Sheet for Patients: EntrepreneurPulse.com.au  Fact Sheet for Healthcare Providers: IncredibleEmployment.be  This test is not yet approved or cleared by the [place] FDA and  has been authorized for detection and/or diagnosis of SARS-CoV-2 by FDA under an Emergency Use Authorization (EUA).  This EUA will remain in effect (meaning this test ca n be used) for the duration of  the COVID-19 declaration under Section 564(b)(1) of the  Act, 21 U.S.C. section 360bbb-3(b)(1), unless the authorization is terminated or revoked sooner.     Influenza A by PCR NEGATIVE NEGATIVE Final   Influenza B by PCR NEGATIVE NEGATIVE Final    Comment: (NOTE) The Xpert Xpress SARS-CoV-2/FLU/RSV plus assay is intended as an aid in the diagnosis of influenza from Nasopharyngeal swab specimens and should not be used as a sole basis for treatment. Nasal washings and aspirates are unacceptable for Xpert Xpress SARS-CoV-2/FLU/RSV testing.  Fact Sheet for Patients: EntrepreneurPulse.com.au  Fact Sheet for Healthcare Providers: IncredibleEmployment.be  This test is not yet approved or cleared by the [place] FDA and has been authorized for detection and/or diagnosis of SARS-CoV-2 by FDA under an Emergency Use Authorization (EUA). This EUA will remain in effect (meaning this test can be used) for the duration of the COVID-19 declaration under Section 564(b)(1) of the Act, 21 U.S.C. section 360bbb-3(b)(1), unless the authorization is terminated or revoked.  Performed at [facility], [place] [address], [place], [place] [address]   Blood Culture (routine x 2)     Status: None (Preliminary result)   Collection Time: 01/12/20  5:44 PM   Specimen: BLOOD  Result Value Ref Range Status   Specimen Description   Final    BLOOD RIGHT ANTECUBITAL Performed at [facility] [address], [place], [place] [address]    Special Requests   Final    BOTTLES DRAWN AEROBIC AND ANAEROBIC Blood Culture adequate volume Performed at [facility] [address] [place], [place] [address]    Culture   Final  NO GROWTH 4 DAYS Performed at [facility], [place] [address], [place], [place] [address]    Report Status PENDING  Incomplete     Labs: Basic Metabolic Panel: Recent Labs  Lab 01/13/20 0420 01/13/20 0420 01/14/20 0443 01/15/20 0534  01/16/20 0559 01/16/20 1251 01/17/20 0410  NA 134*  --  133* 134* 134*  --  133*  K 4.3  --  4.0 4.3 4.2  --  4.5  CL 100  --  100 100 99  --  98  CO2 24  --  '23 23 24  ' --  23  GLUCOSE 268*   < > 295* 300* 280* 419* 266*  BUN 16  --  20 25* 26*  --  23*  CREATININE 0.82  --  0.85 0.77 0.80  --  0.75  CALCIUM 8.5*  --  8.6* 8.4* 8.6*  --  8.7*   < > = values in this interval not displayed.   Liver Function Tests: Recent Labs  Lab 01/13/20 0420 01/14/20 0443 01/15/20 0534 01/16/20 0559 01/17/20 0410  AST 36 32 28 32 24  ALT 32 30 31 35 34  ALKPHOS 59 57 52 55 60  BILITOT 0.8 0.7 0.8 0.8 0.6  PROT 7.3 7.0 7.0 7.2 7.4  ALBUMIN 3.3* 3.1* 3.1* 3.2* 3.3*   CBC: Recent Labs  Lab 01/13/20 0420 01/14/20 0443 01/15/20 0534 01/16/20 0559 01/17/20 0410  WBC 4.7 4.4 5.8 5.0 7.6  NEUTROABS 4.1 3.6 4.8 4.1 5.7  HGB 15.8 15.4 15.7 16.4 17.5*  HCT 45.1 44.3 45.5 47.5 49.8  MCV 86.4 87.5 88.7 87.2 85.0  PLT 141* 176 200 202 276   CBG: Recent Labs  Lab 01/16/20 0750 01/16/20 1201 01/16/20 1640 01/16/20 2109 01/17/20 0824  GLUCAP 265* 421* 364* 377* 235*   Hgb A1c No results for input(s): HGBA1C in the last 72 hours. Lipid Profile No results for input(s): CHOL, HDL, LDLCALC, TRIG, CHOLHDL, LDLDIRECT in the last 72 hours. Thyroid function studies No results for input(s): TSH, T4TOTAL, T3FREE, THYROIDAB in the last 72 hours.  Invalid input(s): FREET3 Urinalysis No results found for: COLORURINE, APPEARANCEUR, LABSPEC, PHURINE, GLUCOSEU, HGBUR, BILIRUBINUR, KETONESUR, PROTEINUR, UROBILINOGEN, NITRITE, LEUKOCYTESUR  FURTHER DISCHARGE INSTRUCTIONS:   Get Medicines reviewed and adjusted: Please take all your medications with you for your next visit with your Primary MD   Laboratory/radiological data: Please request your Primary MD to go over all hospital tests and procedure/radiological results at the follow up, please ask your Primary MD to get all Hospital records sent to  his/her office.   In some cases, they will be blood work, cultures and biopsy results pending at the time of your discharge. Please request that your primary care M.D. goes through all the records of your hospital data and follows up on these results.   Also Note the following: If you experience worsening of your admission symptoms, develop shortness of breath, life threatening emergency, suicidal or homicidal thoughts you must seek medical attention immediately by calling 911 or calling your MD immediately  if symptoms less severe.   You must read complete instructions/literature along with all the possible adverse reactions/side effects for all the Medicines you take and that have been prescribed to you. Take any new Medicines after you have completely understood and accpet all the possible adverse reactions/side effects.    Do not drive when taking Pain medications or sleeping medications (Benzodaizepines)   Do not take more than prescribed Pain, Sleep and Anxiety Medications.  It is not advisable to combine anxiety,sleep and pain medications without talking with your primary care practitioner   Special Instructions: If you have smoked or chewed Tobacco  in the last 2 yrs please stop smoking, stop any regular Alcohol  and or any Recreational drug use.   Wear Seat belts while driving.   Please note: You were cared for by a hospitalist during your hospital stay. Once you are discharged, your primary care physician will handle any further medical issues. Please note that NO REFILLS for any discharge medications will be authorized once you are discharged, as it is imperative that you return to your primary care physician (or establish a relationship with a primary care physician if you do not have one) for your post hospital discharge needs so that they can reassess your need for medications and monitor your lab values.  Time coordinating discharge: 35 minutes  SIGNED:  Marzetta Board, MD,  PhD 01/17/2020, 10:46 AM

## 2020-01-17 NOTE — TOC Progression Note (Signed)
Transition of Care [facility]) - Progression Note    Patient Details  Name: Bruce Arnold MRN: [id number] Date of Birth: 1970/06/28  [facility] CM/SW Contact  Geni Bers, RN Phone Number: 01/17/2020, 10:59 AM  Clinical Narrative:     Appointment made for pt at [facility] on 12/8 at 1050 AM virtual. Confirmed that address was correct in Epic. Pt agreed with appointment and was encouraged to keep this appointment.   Expected Discharge Plan: Home/Self Care Barriers to Discharge: No Barriers Identified  Expected Discharge Plan and Services Expected Discharge Plan: Home/Self Care   Discharge Planning Services: CM Consult, Follow-up appt scheduled     Expected Discharge Date: 01/17/20                                     Social Determinants of Health (SDOH) Interventions    Readmission Risk Interventions No flowsheet data found.

## 2020-01-17 NOTE — Progress Notes (Signed)
SATURATION QUALIFICATIONS: (This note is used to comply with regulatory documentation for home oxygen)  Patient Saturations on Room Air at Rest =95%  Patient Saturations on Room Air while Ambulating = 91%  Patient Saturations on 0 Liters of oxygen while Ambulating = %  Please briefly explain why patient needs home oxygen: patient doesn't need oxygen to maintain adequate O2 saturations.

## 2020-01-26 ENCOUNTER — Ambulatory Visit (INDEPENDENT_AMBULATORY_CARE_PROVIDER_SITE_OTHER): Payer: BC Managed Care – PPO | Admitting: Primary Care

## 2020-01-26 ENCOUNTER — Other Ambulatory Visit: Payer: Self-pay

## 2020-01-26 DIAGNOSIS — Z794 Long term (current) use of insulin: Secondary | ICD-10-CM

## 2020-01-26 DIAGNOSIS — Z7689 Persons encountering health services in other specified circumstances: Secondary | ICD-10-CM

## 2020-01-26 DIAGNOSIS — Z79899 Other long term (current) drug therapy: Secondary | ICD-10-CM | POA: Diagnosis not present

## 2020-01-26 DIAGNOSIS — Z09 Encounter for follow-up examination after completed treatment for conditions other than malignant neoplasm: Secondary | ICD-10-CM | POA: Diagnosis not present

## 2020-01-26 DIAGNOSIS — E119 Type 2 diabetes mellitus without complications: Secondary | ICD-10-CM | POA: Diagnosis not present

## 2020-01-26 MED ORDER — "INSULIN SYRINGE 31G X 5/16"" 0.3 ML MISC": 1.0000 | Freq: Two times a day (BID) | 3 refills | Status: DC

## 2020-01-26 MED ORDER — GLIPIZIDE 10 MG PO TABS: 10.0000 mg | ORAL_TABLET | Freq: Two times a day (BID) | ORAL | 3 refills | Status: AC

## 2020-01-26 MED ORDER — "INSULIN SYRINGE 31G X 5/16"" 0.3 ML MISC": 1.0000 | Freq: Two times a day (BID) | 3 refills | Status: AC

## 2020-01-26 MED ORDER — METFORMIN HCL 1000 MG PO TABS: 1000.0000 mg | ORAL_TABLET | Freq: Two times a day (BID) | ORAL | 1 refills | Status: AC

## 2020-01-26 NOTE — Progress Notes (Signed)
[facility]  Telephone Note  I connected with Bruce Arnold on 01/27/20 at 10:50 AM EST by telephone and verified that I am speaking with the correct person using two identifiers.  Location: Patient: is at home  Provider: Kerin Perna @ [facility]   I discussed the limitations, risks, security and privacy concerns of performing an evaluation and management service by telephone and the availability of in person appointments. I also discussed with the patient that there may be a patient responsible charge related to this service. The patient expressed understanding and agreed to proceed.   History of Present Illness: [name] is a  49 y.o.male presents for follow up from the hospital. Admit date to the hospital was 01/12/20, patient was discharged from the hospital on 01/17/20, patient was admitted for: Pneumonia due to COVID-19 virus. Establishment of care and manage new diagnosis of Type2 Diabetes. Stated that was not a problem he was better . Explained uncontrol DM  Past Medical History:  Diagnosis Date  . Alcohol consumption binge drinking    Current Outpatient Medications on File Prior to Visit  Medication Sig Dispense Refill  . albuterol (VENTOLIN HFA) 108 (90 Base) MCG/ACT inhaler Inhale 2 puffs into the lungs every 4 (four) hours as needed for wheezing or shortness of breath. 1 each 0  . blood glucose meter kit and supplies KIT Dispense based on patient and insurance preference. Use up to four times daily as directed. (FOR ICD-9 250.00, 250.01). 1 each 0  . guaiFENesin-dextromethorphan (ROBITUSSIN DM) 100-10 MG/5ML syrup Take 10 mLs by mouth every 4 (four) hours as needed for cough. 118 mL 0  . insulin isophane & regular human (NOVOLIN 70/30 FLEXPEN RELION) (70-30) 100 UNIT/ML KwikPen Inject 9 Units into the skin 2 (two) times daily with a meal. 15 mL 11  . NEEDLE, DISP, 25 G (B-D HYPODERMIC NEEDLE 25GX5/8") 25G X 5/8" MISC 1 each by Does not apply route 2 (two)  times daily with a meal. 100 each 5   No current facility-administered medications on file prior to visit.   Observations/Objective: Review of Systems  All other systems reviewed and are negative.  Assessment and Plan: Diagnoses and all orders for this visit:  Type 2 diabetes mellitus without complication, with long-term current use of insulin (HCC) A1C 9.3 uncontrolled.  Goal of therapy: is less than 6.5 hemoglobin A1c. . Discussed  co- morbidities with uncontrol diabetes  Complications -diabetic retinopathy, (close your eyes ? What do you see nothing) nephropathy decrease in kidney function- can lead to dialysis-on a machine 3 days a week to filter your kidney, neuropathy- numbness and tinging in your hands and feet,  increase risk of heart attack and stroke, and amputation due to decrease wound healing and circulation. Decrease your risk by taking medication daily as prescribed, monitor carbohydrates- foods that are high in carbohydrates are the following rice, potatoes, breads, sugars, and pastas.  Reduction in the intake (eating) will assist in lowering your blood sugars. Exercise daily at least 30 minutes daily.  -     metFORMIN (GLUCOPHAGE) 1000 MG tablet; Take 1 tablet (1,000 mg total) by mouth 2 (two) times daily with a meal. -   -     glipiZIDE (GLUCOTROL) 10 MG tablet; Take 1 tablet (10 mg total) by mouth 2 (two) times daily before a meal. -     Insulin Syringe-Needle U-100 (INSULIN SYRINGE .3CC/31GX5/16") 31G X 5/16" 0.3 ML MISC; 1 each by Does not apply route in the  morning and at bedtime.  Hospital discharge follow-up  admitted for: Pneumonia due to COVID-19 virus. On 01/12/2020 new dx DM2.   Retrieved from d/c : Follow up with [employer] on 01/26/2020; Your appointment is Virtual/telephoneat 10:50 AM. You should recieve a call before this appointment. Please keep this appointment, for doctor follow up visit after being in the  hospital.  Encounter to establish care Establish care with PCP  Medication management -     metFORMIN (GLUCOPHAGE) 1000 MG tablet; Take 1 tablet (1,000 mg total) by mouth 2 (two) times daily with a meal. -     glipiZIDE (GLUCOTROL) 10 MG tablet; Take 1 tablet (10 mg total) by mouth 2 (two) times daily before a meal. -     Insulin Syringe-Needle U-100 (INSULIN SYRINGE .3CC/31GX5/16") 31G X 5/16" 0.3 ML MISC; 1 each by Does not apply route in the morning and at bedtime.   Follow Up Instructions:  I discussed the assessment and treatment plan with the patient. The patient was provided an opportunity to ask questions and all were answered. The patient agreed with the plan and demonstrated an understanding of the instructions.   The patient was advised to call back or seek an in-person evaluation if the symptoms worsen or if the condition fails to improve as anticipated.  I provided 40 minutes of non-face-to-face time during this encounter.   Kerin Perna, NP

## 2020-04-17 ENCOUNTER — Ambulatory Visit (INDEPENDENT_AMBULATORY_CARE_PROVIDER_SITE_OTHER): Payer: Self-pay | Admitting: Primary Care

## 2024-04-13 ENCOUNTER — Ambulatory Visit: Admitting: Internal Medicine
